# Patient Record
Sex: Female | Born: 1995 | Hispanic: No | Marital: Married | Smoking: Never smoker
Health system: Southern US, Community
[De-identification: ages and names within clinical notes are randomized; demographics above are authoritative.]

## PROBLEM LIST (undated history)

## (undated) ENCOUNTER — Ambulatory Visit

## (undated) DIAGNOSIS — L680 Hirsutism: Secondary | ICD-10-CM

## (undated) DIAGNOSIS — F419 Anxiety disorder, unspecified: Secondary | ICD-10-CM

## (undated) DIAGNOSIS — Z789 Other specified health status: Secondary | ICD-10-CM

## (undated) HISTORY — DX: Anxiety disorder, unspecified: F41.9

## (undated) HISTORY — DX: Hirsutism: L68.0

## (undated) HISTORY — PX: NO PAST SURGERIES: SHX2092

---

## 2015-02-03 ENCOUNTER — Other Ambulatory Visit: Payer: Self-pay | Admitting: Advanced Practice Midwife

## 2015-02-03 DIAGNOSIS — N6320 Unspecified lump in the left breast, unspecified quadrant: Secondary | ICD-10-CM

## 2015-03-15 ENCOUNTER — Other Ambulatory Visit: Payer: Self-pay

## 2016-09-10 ENCOUNTER — Encounter (HOSPITAL_COMMUNITY): Payer: Self-pay | Admitting: Emergency Medicine

## 2016-09-10 ENCOUNTER — Emergency Department (HOSPITAL_COMMUNITY)
Admission: EM | Admit: 2016-09-10 | Discharge: 2016-09-10 | Disposition: A | Discharge status: Home / Self Care | Payer: BLUE CROSS/BLUE SHIELD | Attending: Emergency Medicine | Admitting: Emergency Medicine

## 2016-09-10 DIAGNOSIS — Y999 Unspecified external cause status: Secondary | ICD-10-CM | POA: Diagnosis not present

## 2016-09-10 DIAGNOSIS — X503XXA Overexertion from repetitive movements, initial encounter: Secondary | ICD-10-CM | POA: Insufficient documentation

## 2016-09-10 DIAGNOSIS — Y929 Unspecified place or not applicable: Secondary | ICD-10-CM | POA: Diagnosis not present

## 2016-09-10 DIAGNOSIS — S83005A Unspecified dislocation of left patella, initial encounter: Secondary | ICD-10-CM

## 2016-09-10 DIAGNOSIS — Y9341 Activity, dancing: Secondary | ICD-10-CM | POA: Diagnosis not present

## 2016-09-10 DIAGNOSIS — S8992XA Unspecified injury of left lower leg, initial encounter: Secondary | ICD-10-CM | POA: Diagnosis present

## 2016-09-10 MED ORDER — IBUPROFEN 800 MG PO TABS
800.0000 mg | ORAL_TABLET | Freq: Once | ORAL | Status: AC
  Administered 2016-09-10: 800 mg via ORAL
  Filled 2016-09-10: qty 1

## 2016-09-10 MED ORDER — DIPHENHYDRAMINE HCL 25 MG PO CAPS
25.0000 mg | ORAL_CAPSULE | Freq: Once | ORAL | Status: AC
  Administered 2016-09-10: 25 mg via ORAL
  Filled 2016-09-10: qty 1
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
## 2016-09-10 NOTE — ED Notes (Signed)
Patient Alert and oriented X4. Stable and ambulatory. Patient verbalized understanding of the discharge instructions.  Patient belongings were taken by the patient.  

## 2016-09-10 NOTE — Discharge Instructions (Signed)
Wear the knee immobilizer as needed/  Apply ice several times a day.  Take ibuprofen or naproxen as needed for pain.

## 2016-09-10 NOTE — ED Provider Notes (Signed)
[facility]-EMERGENCY DEPT Provider Note   CSN: [id number] Arrival date & time: 09/10/16  0103   By signing my name below, I, Clarisse GougeXavier Herndon, attest that this documentation has been prepared under the direction and in the presence of Dione BoozeGlick, Deshaun Schou, MD. Electronically signed, Clarisse GougeXavier Herndon, ED Scribe. 09/10/16. 1:23 AM.   History   Chief Complaint Chief Complaint  Patient presents with  . Knee Pain    dislocation   The history is provided by the patient, medical records, a significant other and a relative. No language interpreter was used.    Kristine Singleton is a 21 y.o. female BIB EMS to the Emergency Department concerning L knee pain onset shortly PTA. She states she turned around after dancing, felt her knee crack and fell d/t immediate onset of pain. No head injury or LOC during this fall. She describes 6/10, constant pain worse with straightening the leg and applying pressure on it; she has the leg in a bent position initially on evaluation. Pt given 250 mcg fentanyl PTA with EMS; unable to determine if this intervention was successful. No headache, numbness, weakness, wounds, swelling or color change. No N/V or fever. No other complaints at this time.   History reviewed. No pertinent past medical history.  There are no active problems to display for this patient.   History reviewed. No pertinent surgical history.  OB History    No data available       Home Medications    Prior to Admission medications   Not on File    Family History No family history on file.  Social History Social History  Substance Use Topics  . Smoking status: Never Smoker  . Smokeless tobacco: Never Used  . Alcohol use No     Allergies   Patient has no allergy information on record.   Review of Systems Review of Systems  Constitutional: Negative for chills and fever.  HENT: Negative for facial swelling.   Gastrointestinal: Negative for nausea and vomiting.  Musculoskeletal: Positive for  arthralgias and gait problem. Negative for joint swelling.  Skin: Negative for color change and wound.  Neurological: Negative for syncope, weakness, numbness and headaches.     Physical Exam Updated Vital Signs BP (!) 144/95 (BP Location: Right Arm)   Pulse 99   Temp 98.9 F (37.2 C) (Oral)   Resp 18   SpO2 99%   Physical Exam  Constitutional: She is oriented to person, place, and time. She appears well-developed and well-nourished.  Appears anxious and uncomfortable.  HENT:  Head: Normocephalic and atraumatic.  Eyes: Pupils are equal, round, and reactive to light. EOM are normal.  Neck: Normal range of motion. Neck supple. No JVD present.  Cardiovascular: Normal rate, regular rhythm and normal heart sounds.   No murmur heard. Pulmonary/Chest: Effort normal and breath sounds normal. She has no wheezes. She has no rales. She exhibits no tenderness.  Abdominal: Soft. Bowel sounds are normal. She exhibits no distension and no mass. There is no tenderness.  Musculoskeletal: Normal range of motion. She exhibits deformity. She exhibits no edema.  Deformity of the L knee consistent with knee dislocation laterally  Lymphadenopathy:    She has no cervical adenopathy.  Neurological: She is alert and oriented to person, place, and time. No cranial nerve deficit. She exhibits normal muscle tone. Coordination normal.  Skin: Skin is warm and dry. No rash noted.  Psychiatric: She has a normal mood and affect. Her behavior is normal. Judgment and thought content normal.  Nursing note and vitals reviewed.    ED Treatments / Results  DIAGNOSTIC STUDIES: Oxygen Saturation is 99% on RA, NL by my interpretation.    COORDINATION OF CARE: 1:10 AM-Discussed next steps with pt. Pt verbalized understanding and is agreeable with the plan. Will place in knee immobilizer and d/c with resources and referral to orthopedic specialist. Pt prepared for d/c, advised of symptomatic care at home, F/U  instructions and return precautions.   Procedures Procedures (including critical care time) Reduction of dislocation Date/Time: 1:24 AM Performed by: KGMWN,[email or web address] Authorized by: DGUYQ,IHKVQ Consent: Verbal consent obtained. Risks and benefits: risks, benefits and alternatives were discussed Consent given by: patient Required items: required blood products, implants, devices, and special equipment available Time out: Immediately prior to procedure a "time out" was called to verify the correct patient, procedure, equipment, support staff and site/side marked as required.  Patient sedated: No  Vitals: Vital signs were monitored during sedation. Patient tolerance: Patient tolerated the procedure well with no immediate complications. Joint: Left patella Reduction technique: Knee extension and manipulation of patella. Knee immobilizer applied.    Medications Ordered in ED Medications - No data to display   Initial Impression / Assessment and Plan / ED Course  I have reviewed the triage vital signs and the nursing notes.  Dislocated patella reduced without difficulty. She is placed in a knee immobilizer use as needed, referred to orthopedics for follow-up. No old records in the [facility] system.  Final Clinical Impressions(s) / ED Diagnoses   Final diagnoses:  Closed dislocation of patella, left, initial encounter    New Prescriptions New Prescriptions   No medications on file    I personally performed the services described in this documentation, which was scribed in my presence. The recorded information has been reviewed and is accurate.      Dione Booze, MD 09/10/16 [phone]

## 2018-06-28 LAB — OB RESULTS CONSOLE HEPATITIS B SURFACE ANTIGEN: Hepatitis B Surface Ag: NEGATIVE

## 2018-06-28 LAB — OB RESULTS CONSOLE RPR: RPR: NONREACTIVE

## 2018-06-28 LAB — OB RESULTS CONSOLE ABO/RH: RH Type: POSITIVE

## 2018-06-28 LAB — OB RESULTS CONSOLE GC/CHLAMYDIA
Chlamydia: NEGATIVE
Gonorrhea: NEGATIVE

## 2018-06-28 LAB — OB RESULTS CONSOLE HIV ANTIBODY (ROUTINE TESTING): HIV: NONREACTIVE

## 2018-06-28 LAB — OB RESULTS CONSOLE ANTIBODY SCREEN: Antibody Screen: NEGATIVE

## 2018-06-28 LAB — OB RESULTS CONSOLE RUBELLA ANTIBODY, IGM: Rubella: IMMUNE

## 2018-10-25 ENCOUNTER — Other Ambulatory Visit: Payer: Self-pay

## 2018-10-25 ENCOUNTER — Encounter (HOSPITAL_COMMUNITY): Payer: Self-pay

## 2018-10-25 ENCOUNTER — Inpatient Hospital Stay (HOSPITAL_COMMUNITY)
Admission: AD | Admit: 2018-10-25 | Discharge: 2018-10-25 | Disposition: A | Discharge status: Home / Self Care | Payer: BC Managed Care – PPO | Attending: Obstetrics and Gynecology | Admitting: Obstetrics and Gynecology

## 2018-10-25 DIAGNOSIS — Z3689 Encounter for other specified antenatal screening: Secondary | ICD-10-CM | POA: Diagnosis not present

## 2018-10-25 DIAGNOSIS — Y92411 Interstate highway as the place of occurrence of the external cause: Secondary | ICD-10-CM | POA: Insufficient documentation

## 2018-10-25 DIAGNOSIS — Z3A27 27 weeks gestation of pregnancy: Secondary | ICD-10-CM | POA: Diagnosis not present

## 2018-10-25 DIAGNOSIS — O26892 Other specified pregnancy related conditions, second trimester: Secondary | ICD-10-CM | POA: Insufficient documentation

## 2018-10-25 HISTORY — DX: Other specified health status: Z78.9

## 2018-10-25 LAB — URINALYSIS, ROUTINE W REFLEX MICROSCOPIC
Bilirubin Urine: NEGATIVE
Glucose, UA: NEGATIVE mg/dL
Hgb urine dipstick: NEGATIVE
Ketones, ur: NEGATIVE mg/dL
Leukocytes,Ua: NEGATIVE
Nitrite: NEGATIVE
Protein, ur: NEGATIVE mg/dL
Specific Gravity, Urine: 1.018 (ref 1.005–1.030)
pH: 6 (ref 5.0–8.0)

## 2018-10-25 MED ORDER — ACETAMINOPHEN 500 MG PO TABS
1000.0000 mg | ORAL_TABLET | Freq: Four times a day (QID) | ORAL | Status: DC | PRN
  Administered 2018-10-25: 1000 mg via ORAL
  Filled 2018-10-25: qty 2
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
## 2018-12-24 LAB — OB RESULTS CONSOLE GBS: GBS: NEGATIVE

## 2019-01-17 ENCOUNTER — Telehealth (HOSPITAL_COMMUNITY): Payer: Self-pay | Admitting: *Deleted

## 2019-01-17 ENCOUNTER — Encounter (HOSPITAL_COMMUNITY): Payer: Self-pay | Admitting: *Deleted

## 2019-01-17 ENCOUNTER — Other Ambulatory Visit (HOSPITAL_COMMUNITY)
Admission: RE | Admit: 2019-01-17 | Discharge: 2019-01-17 | Disposition: A | Discharge status: Home / Self Care | Payer: BC Managed Care – PPO | Source: Ambulatory Visit | Attending: Obstetrics & Gynecology | Admitting: Obstetrics & Gynecology

## 2019-01-17 ENCOUNTER — Other Ambulatory Visit: Payer: Self-pay | Admitting: Obstetrics & Gynecology

## 2019-01-17 DIAGNOSIS — Z20828 Contact with and (suspected) exposure to other viral communicable diseases: Secondary | ICD-10-CM | POA: Insufficient documentation

## 2019-01-17 DIAGNOSIS — Z01812 Encounter for preprocedural laboratory examination: Secondary | ICD-10-CM | POA: Insufficient documentation

## 2019-01-17 LAB — SARS CORONAVIRUS 2 (TAT 6-24 HRS): SARS Coronavirus 2: NEGATIVE

## 2019-01-17 NOTE — Telephone Encounter (Signed)
Preadmission screen  

## 2019-01-18 ENCOUNTER — Other Ambulatory Visit: Payer: Self-pay

## 2019-01-18 ENCOUNTER — Inpatient Hospital Stay (HOSPITAL_COMMUNITY)
Admission: AD | Admit: 2019-01-18 | Discharge: 2019-01-20 | DRG: 788 | Disposition: A | Discharge status: Home / Self Care | Payer: BC Managed Care – PPO | Attending: Obstetrics & Gynecology | Admitting: Obstetrics & Gynecology

## 2019-01-18 ENCOUNTER — Inpatient Hospital Stay (HOSPITAL_COMMUNITY): Payer: BC Managed Care – PPO | Admitting: Anesthesiology

## 2019-01-18 ENCOUNTER — Inpatient Hospital Stay (HOSPITAL_COMMUNITY): Payer: BC Managed Care – PPO

## 2019-01-18 ENCOUNTER — Encounter (HOSPITAL_COMMUNITY)
Admission: AD | Disposition: A | Discharge status: Home / Self Care | Payer: Self-pay | Source: Home / Self Care | Attending: Obstetrics & Gynecology

## 2019-01-18 ENCOUNTER — Encounter (HOSPITAL_COMMUNITY): Payer: Self-pay

## 2019-01-18 DIAGNOSIS — Z3A39 39 weeks gestation of pregnancy: Secondary | ICD-10-CM | POA: Diagnosis not present

## 2019-01-18 DIAGNOSIS — O26893 Other specified pregnancy related conditions, third trimester: Secondary | ICD-10-CM | POA: Diagnosis present

## 2019-01-18 DIAGNOSIS — O322XX0 Maternal care for transverse and oblique lie, not applicable or unspecified: Secondary | ICD-10-CM | POA: Diagnosis present

## 2019-01-18 DIAGNOSIS — Z20828 Contact with and (suspected) exposure to other viral communicable diseases: Secondary | ICD-10-CM | POA: Diagnosis present

## 2019-01-18 DIAGNOSIS — O99284 Endocrine, nutritional and metabolic diseases complicating childbirth: Secondary | ICD-10-CM | POA: Diagnosis present

## 2019-01-18 DIAGNOSIS — E282 Polycystic ovarian syndrome: Secondary | ICD-10-CM | POA: Diagnosis present

## 2019-01-18 DIAGNOSIS — Z98891 History of uterine scar from previous surgery: Secondary | ICD-10-CM

## 2019-01-18 DIAGNOSIS — L680 Hirsutism: Secondary | ICD-10-CM | POA: Diagnosis present

## 2019-01-18 DIAGNOSIS — Z3490 Encounter for supervision of normal pregnancy, unspecified, unspecified trimester: Secondary | ICD-10-CM | POA: Diagnosis present

## 2019-01-18 DIAGNOSIS — O326XX0 Maternal care for compound presentation, not applicable or unspecified: Principal | ICD-10-CM | POA: Diagnosis present

## 2019-01-18 DIAGNOSIS — O99892 Other specified diseases and conditions complicating childbirth: Secondary | ICD-10-CM

## 2019-01-18 LAB — CBC
HCT: 38.1 % (ref 36.0–46.0)
Hemoglobin: 12.4 g/dL (ref 12.0–15.0)
MCH: 28 pg (ref 26.0–34.0)
MCHC: 32.5 g/dL (ref 30.0–36.0)
MCV: 86 fL (ref 80.0–100.0)
Platelets: 253 10*3/uL (ref 150–400)
RBC: 4.43 MIL/uL (ref 3.87–5.11)
RDW: 14.2 % (ref 11.5–15.5)
WBC: 13.2 10*3/uL — ABNORMAL HIGH (ref 4.0–10.5)
nRBC: 0 % (ref 0.0–0.2)

## 2019-01-18 LAB — TYPE AND SCREEN
ABO/RH(D): B POS
Antibody Screen: NEGATIVE

## 2019-01-18 LAB — ABO/RH: ABO/RH(D): B POS

## 2019-01-18 LAB — RPR: RPR Ser Ql: NONREACTIVE

## 2019-01-18 SURGERY — Surgical Case
Anesthesia: Spinal

## 2019-01-18 MED ORDER — DIPHENHYDRAMINE HCL 25 MG PO CAPS
25.0000 mg | ORAL_CAPSULE | Freq: Four times a day (QID) | ORAL | Status: DC | PRN
  Administered 2019-01-19: 25 mg via ORAL
  Filled 2019-01-18: qty 1

## 2019-01-18 MED ORDER — ACETAMINOPHEN 325 MG PO TABS
650.0000 mg | ORAL_TABLET | ORAL | Status: DC | PRN
  Administered 2019-01-18 – 2019-01-20 (×5): 650 mg via ORAL
  Filled 2019-01-18 (×5): qty 2

## 2019-01-18 MED ORDER — SIMETHICONE 80 MG PO CHEW
80.0000 mg | CHEWABLE_TABLET | Freq: Three times a day (TID) | ORAL | Status: DC
  Administered 2019-01-19 – 2019-01-20 (×4): 80 mg via ORAL
  Filled 2019-01-18 (×4): qty 1

## 2019-01-18 MED ORDER — PRENATAL MULTIVITAMIN CH
1.0000 | ORAL_TABLET | Freq: Every day | ORAL | Status: DC
  Administered 2019-01-19 – 2019-01-20 (×2): 1 via ORAL
  Filled 2019-01-18 (×2): qty 1

## 2019-01-18 MED ORDER — IBUPROFEN 800 MG PO TABS
800.0000 mg | ORAL_TABLET | Freq: Four times a day (QID) | ORAL | Status: DC
  Administered 2019-01-20: 800 mg via ORAL
  Filled 2019-01-18: qty 1

## 2019-01-18 MED ORDER — PHENYLEPHRINE HCL-NACL 20-0.9 MG/250ML-% IV SOLN
INTRAVENOUS | Status: DC | PRN
  Administered 2019-01-18: 60 ug/min via INTRAVENOUS

## 2019-01-18 MED ORDER — OXYTOCIN 40 UNITS IN NORMAL SALINE INFUSION - SIMPLE MED: 2.5000 [IU]/h | INTRAVENOUS | Status: AC

## 2019-01-18 MED ORDER — MORPHINE SULFATE (PF) 0.5 MG/ML IJ SOLN
INTRAMUSCULAR | Status: AC
  Filled 2019-01-18: qty 10

## 2019-01-18 MED ORDER — NALBUPHINE SYRINGE 5 MG/0.5 ML
5.0000 mg | INJECTION | Freq: Once | INTRAMUSCULAR | Status: DC | PRN
  Filled 2019-01-18: qty 0.5

## 2019-01-18 MED ORDER — LACTATED RINGERS IV SOLN
INTRAVENOUS | Status: DC
  Administered 2019-01-18 (×2): via INTRAVENOUS

## 2019-01-18 MED ORDER — FENTANYL CITRATE (PF) 100 MCG/2ML IJ SOLN: 25.0000 ug | INTRAMUSCULAR | Status: DC | PRN

## 2019-01-18 MED ORDER — ONDANSETRON HCL 4 MG/2ML IJ SOLN
INTRAMUSCULAR | Status: DC | PRN
  Administered 2019-01-18: 4 mg via INTRAVENOUS

## 2019-01-18 MED ORDER — MENTHOL 3 MG MT LOZG: 1.0000 | LOZENGE | OROMUCOSAL | Status: DC | PRN

## 2019-01-18 MED ORDER — OXYTOCIN BOLUS FROM INFUSION: 500.0000 mL | Freq: Once | INTRAVENOUS | Status: DC

## 2019-01-18 MED ORDER — SENNOSIDES-DOCUSATE SODIUM 8.6-50 MG PO TABS
2.0000 | ORAL_TABLET | ORAL | Status: DC
  Administered 2019-01-18 – 2019-01-19 (×2): 2 via ORAL
  Filled 2019-01-18 (×2): qty 2

## 2019-01-18 MED ORDER — OXYTOCIN 40 UNITS IN NORMAL SALINE INFUSION - SIMPLE MED
INTRAVENOUS | Status: DC | PRN
  Administered 2019-01-18: 40 [IU] via INTRAVENOUS

## 2019-01-18 MED ORDER — ACETAMINOPHEN 325 MG PO TABS: 650.0000 mg | ORAL_TABLET | ORAL | Status: DC | PRN

## 2019-01-18 MED ORDER — BUPIVACAINE IN DEXTROSE 0.75-8.25 % IT SOLN
INTRATHECAL | Status: DC | PRN
  Administered 2019-01-18: 1.6 mL via INTRATHECAL

## 2019-01-18 MED ORDER — SIMETHICONE 80 MG PO CHEW
80.0000 mg | CHEWABLE_TABLET | ORAL | Status: DC
  Administered 2019-01-18 – 2019-01-19 (×2): 80 mg via ORAL
  Filled 2019-01-18 (×2): qty 1

## 2019-01-18 MED ORDER — PHENYLEPHRINE HCL-NACL 20-0.9 MG/250ML-% IV SOLN
INTRAVENOUS | Status: AC
  Filled 2019-01-18: qty 250

## 2019-01-18 MED ORDER — OXYCODONE HCL 5 MG PO TABS: 5.0000 mg | ORAL_TABLET | ORAL | Status: DC | PRN

## 2019-01-18 MED ORDER — FENTANYL CITRATE (PF) 100 MCG/2ML IJ SOLN
INTRAMUSCULAR | Status: DC | PRN
  Administered 2019-01-18: 15 ug via INTRATHECAL

## 2019-01-18 MED ORDER — DIPHENHYDRAMINE HCL 25 MG PO CAPS: 25.0000 mg | ORAL_CAPSULE | ORAL | Status: DC | PRN

## 2019-01-18 MED ORDER — MORPHINE SULFATE (PF) 0.5 MG/ML IJ SOLN
INTRAMUSCULAR | Status: DC | PRN
  Administered 2019-01-18: .15 mg via INTRATHECAL

## 2019-01-18 MED ORDER — CEFAZOLIN SODIUM-DEXTROSE 2-4 GM/100ML-% IV SOLN
2.0000 g | INTRAVENOUS | Status: AC
  Administered 2019-01-18: 2 g via INTRAVENOUS

## 2019-01-18 MED ORDER — BUTORPHANOL TARTRATE 1 MG/ML IJ SOLN: 1.0000 mg | INTRAMUSCULAR | Status: DC | PRN

## 2019-01-18 MED ORDER — COCONUT OIL OIL: 1.0000 "application " | TOPICAL_OIL | Status: DC | PRN

## 2019-01-18 MED ORDER — TERBUTALINE SULFATE 1 MG/ML IJ SOLN: 0.2500 mg | Freq: Once | INTRAMUSCULAR | Status: DC | PRN

## 2019-01-18 MED ORDER — ONDANSETRON HCL 4 MG/2ML IJ SOLN: 4.0000 mg | Freq: Four times a day (QID) | INTRAMUSCULAR | Status: DC | PRN

## 2019-01-18 MED ORDER — NALOXONE HCL 4 MG/10ML IJ SOLN
1.0000 ug/kg/h | INTRAVENOUS | Status: DC | PRN
  Filled 2019-01-18: qty 5

## 2019-01-18 MED ORDER — TETANUS-DIPHTH-ACELL PERTUSSIS 5-2.5-18.5 LF-MCG/0.5 IM SUSP: 0.5000 mL | Freq: Once | INTRAMUSCULAR | Status: DC

## 2019-01-18 MED ORDER — OXYTOCIN 40 UNITS IN NORMAL SALINE INFUSION - SIMPLE MED: 2.5000 [IU]/h | INTRAVENOUS | Status: DC

## 2019-01-18 MED ORDER — NALOXONE HCL 0.4 MG/ML IJ SOLN: 0.4000 mg | INTRAMUSCULAR | Status: DC | PRN

## 2019-01-18 MED ORDER — ONDANSETRON HCL 4 MG/2ML IJ SOLN
INTRAMUSCULAR | Status: AC
  Filled 2019-01-18: qty 2

## 2019-01-18 MED ORDER — SOD CITRATE-CITRIC ACID 500-334 MG/5ML PO SOLN
30.0000 mL | ORAL | Status: DC | PRN
  Administered 2019-01-18: 30 mL via ORAL
  Filled 2019-01-18: qty 30

## 2019-01-18 MED ORDER — MEPERIDINE HCL 25 MG/ML IJ SOLN: 6.2500 mg | INTRAMUSCULAR | Status: DC | PRN

## 2019-01-18 MED ORDER — LIDOCAINE HCL (PF) 1 % IJ SOLN: 30.0000 mL | INTRAMUSCULAR | Status: DC | PRN

## 2019-01-18 MED ORDER — DIBUCAINE (PERIANAL) 1 % EX OINT: 1.0000 "application " | TOPICAL_OINTMENT | CUTANEOUS | Status: DC | PRN

## 2019-01-18 MED ORDER — KETOROLAC TROMETHAMINE 30 MG/ML IJ SOLN
INTRAMUSCULAR | Status: AC
  Filled 2019-01-18: qty 1

## 2019-01-18 MED ORDER — KETOROLAC TROMETHAMINE 30 MG/ML IJ SOLN
30.0000 mg | Freq: Four times a day (QID) | INTRAMUSCULAR | Status: AC | PRN
  Administered 2019-01-18: 30 mg via INTRAMUSCULAR

## 2019-01-18 MED ORDER — FENTANYL CITRATE (PF) 100 MCG/2ML IJ SOLN
INTRAMUSCULAR | Status: AC
  Filled 2019-01-18: qty 2

## 2019-01-18 MED ORDER — SODIUM CHLORIDE 0.9 % IV SOLN
INTRAVENOUS | Status: DC | PRN
  Administered 2019-01-18: 19:00:00 via INTRAVENOUS

## 2019-01-18 MED ORDER — KETOROLAC TROMETHAMINE 30 MG/ML IJ SOLN
30.0000 mg | Freq: Four times a day (QID) | INTRAMUSCULAR | Status: AC | PRN
  Administered 2019-01-19: 30 mg via INTRAVENOUS
  Filled 2019-01-18: qty 1

## 2019-01-18 MED ORDER — NALBUPHINE SYRINGE 5 MG/0.5 ML
5.0000 mg | INJECTION | INTRAMUSCULAR | Status: DC | PRN
  Filled 2019-01-18: qty 0.5

## 2019-01-18 MED ORDER — ONDANSETRON HCL 4 MG/2ML IJ SOLN: 4.0000 mg | Freq: Three times a day (TID) | INTRAMUSCULAR | Status: DC | PRN

## 2019-01-18 MED ORDER — SIMETHICONE 80 MG PO CHEW: 80.0000 mg | CHEWABLE_TABLET | ORAL | Status: DC | PRN

## 2019-01-18 MED ORDER — WITCH HAZEL-GLYCERIN EX PADS: 1.0000 "application " | MEDICATED_PAD | CUTANEOUS | Status: DC | PRN

## 2019-01-18 MED ORDER — DIPHENHYDRAMINE HCL 50 MG/ML IJ SOLN: 12.5000 mg | INTRAMUSCULAR | Status: DC | PRN

## 2019-01-18 MED ORDER — LACTATED RINGERS IV SOLN
INTRAVENOUS | Status: DC
  Administered 2019-01-19 (×2): via INTRAVENOUS

## 2019-01-18 MED ORDER — OXYTOCIN 40 UNITS IN NORMAL SALINE INFUSION - SIMPLE MED
INTRAVENOUS | Status: AC
  Filled 2019-01-18: qty 1000

## 2019-01-18 MED ORDER — ZOLPIDEM TARTRATE 5 MG PO TABS: 5.0000 mg | ORAL_TABLET | Freq: Every evening | ORAL | Status: DC | PRN

## 2019-01-18 MED ORDER — LACTATED RINGERS IV SOLN: 500.0000 mL | INTRAVENOUS | Status: DC | PRN

## 2019-01-18 MED ORDER — SODIUM CHLORIDE 0.9% FLUSH: 3.0000 mL | INTRAVENOUS | Status: DC | PRN

## 2019-01-18 MED ORDER — OXYTOCIN 40 UNITS IN NORMAL SALINE INFUSION - SIMPLE MED
1.0000 m[IU]/min | INTRAVENOUS | Status: DC
  Administered 2019-01-18: 2 m[IU]/min via INTRAVENOUS
  Filled 2019-01-18: qty 1000

## 2019-01-18 MED ORDER — KETOROLAC TROMETHAMINE 30 MG/ML IJ SOLN
30.0000 mg | Freq: Four times a day (QID) | INTRAMUSCULAR | Status: DC
  Administered 2019-01-19: 30 mg via INTRAVENOUS
  Filled 2019-01-18: qty 1

## 2019-01-18 SURGICAL SUPPLY — 37 items
BENZOIN TINCTURE PRP APPL 2/3 (GAUZE/BANDAGES/DRESSINGS) ×3 IMPLANT
CHLORAPREP W/TINT 26ML (MISCELLANEOUS) ×3 IMPLANT
CLAMP CORD UMBIL (MISCELLANEOUS) IMPLANT
CLOSURE STERI STRIP 1/2 X4 (GAUZE/BANDAGES/DRESSINGS) ×3 IMPLANT
CLOSURE WOUND 1/2 X4 (GAUZE/BANDAGES/DRESSINGS)
CLOTH BEACON ORANGE TIMEOUT ST (SAFETY) ×3 IMPLANT
DRSG OPSITE POSTOP 4X10 (GAUZE/BANDAGES/DRESSINGS) ×3 IMPLANT
ELECT REM PT RETURN 9FT ADLT (ELECTROSURGICAL) ×3
ELECTRODE REM PT RTRN 9FT ADLT (ELECTROSURGICAL) ×1 IMPLANT
EXTRACTOR VACUUM KIWI (MISCELLANEOUS) IMPLANT
EXTRACTOR VACUUM M CUP 4 TUBE (SUCTIONS) IMPLANT
EXTRACTOR VACUUM M CUP 4' TUBE (SUCTIONS)
GLOVE BIO SURGEON STRL SZ7 (GLOVE) ×3 IMPLANT
GLOVE BIOGEL PI IND STRL 7.0 (GLOVE) ×2 IMPLANT
GLOVE BIOGEL PI INDICATOR 7.0 (GLOVE) ×4
GOWN STRL REUS W/TWL LRG LVL3 (GOWN DISPOSABLE) ×6 IMPLANT
KIT ABG SYR 3ML LUER SLIP (SYRINGE) IMPLANT
NEEDLE HYPO 25X5/8 SAFETYGLIDE (NEEDLE) IMPLANT
NS IRRIG 1000ML POUR BTL (IV SOLUTION) ×3 IMPLANT
PACK C SECTION WH (CUSTOM PROCEDURE TRAY) ×3 IMPLANT
PAD OB MATERNITY 4.3X12.25 (PERSONAL CARE ITEMS) ×3 IMPLANT
RTRCTR C-SECT PINK 25CM LRG (MISCELLANEOUS) IMPLANT
STRIP CLOSURE SKIN 1/2X4 (GAUZE/BANDAGES/DRESSINGS) IMPLANT
SUT MNCRL 0 VIOLET CTX 36 (SUTURE) ×2 IMPLANT
SUT MONOCRYL 0 CTX 36 (SUTURE) ×4
SUT PLAIN 0 NONE (SUTURE) IMPLANT
SUT PLAIN 2 0 (SUTURE)
SUT PLAIN ABS 2-0 CT1 27XMFL (SUTURE) IMPLANT
SUT VIC AB 0 CT1 27 (SUTURE) ×4
SUT VIC AB 0 CT1 27XBRD ANBCTR (SUTURE) ×2 IMPLANT
SUT VIC AB 2-0 CT1 27 (SUTURE) ×2
SUT VIC AB 2-0 CT1 TAPERPNT 27 (SUTURE) ×1 IMPLANT
SUT VIC AB 4-0 KS 27 (SUTURE) ×3 IMPLANT
SUT VICRYL 0 TIES 12 18 (SUTURE) IMPLANT
TOWEL OR 17X24 6PK STRL BLUE (TOWEL DISPOSABLE) ×3 IMPLANT
TRAY FOLEY W/BAG SLVR 14FR LF (SET/KITS/TRAYS/PACK) IMPLANT
WATER STERILE IRR 1000ML POUR (IV SOLUTION) ×3 IMPLANT

## 2019-01-18 NOTE — Op Note (Signed)
Cesarean Section Procedure Note   Kristine Singleton  01/18/2019  Indications: Compound presentation with hand prolapse 39.3 wks, labor IOL for favorable cervix and borderline pelvis. Cephalic presentation at amniotomy with fingers palpated on right of the head. All position changes failed to move hand away and as she dilated to 4 cm, hand prolapsed out of the cervix with contractions while head was still palpable towards maternal left. Baby was not in longitudinal lie at this point.   Pre-operative Diagnosis: primary csection for hand presentation.   Post-operative Diagnosis: Same   Surgeon:  Shea Evans, MD  Assistants: Carlean Jews, CNM  Anesthesia: spinal   Procedure Details:  The patient was seen in the Labor Room. The risks, benefits, complications, treatment options, and expected outcomes were discussed with the patient. The patient concurred with the proposed plan, giving informed consent. identified as Kristine Singleton and the procedure verified as C-Section Delivery. A Time Out was held and the above information confirmed. 2 gm Ancef given.  After induction of anesthesia, the patient was draped and prepped in the usual sterile manner, foley was draining urine well.  A pfannenstiel incision was made and carried down through the subcutaneous tissue to the fascia. Fascial incision was made and extended transversely. The fascia was separated from the underlying rectus tissue superiorly and inferiorly. The peritoneum was identified and entered. Peritoneal incision was extended longitudinally. Alexis-O retractor placed. The utero-vesical peritoneal reflection was incised transversely and the bladder flap was bluntly freed from the lower uterine segment. A low transverse uterine incision was made. Head was easy to reach as most of it was in left lower segment and right shoulder was in midline with rightarm extended towards the cervix and right hand in the cervix. Delivery was cephalic after  pushing shoulder back to the right and centering the head at the hysterotomy, followed by flexion, rotation of head and delivery. Two loose nuchal cord loops released and baby was delivered. Girl baby delivered at 18.36 hors with vigorous cry. Apgar scores of 8 at one minute and 9 at five minutes. Delayed cord clamping done at 1 minute and baby handed to NICU team in attendance. Cord ph was not sent. Cord blood was obtained for evaluation. The placenta was removed Intact and appeared normal. The uterine outline, tubes and ovaries appeared normal}. The uterine incision was closed with running locked sutures of . A second imbricating layer sutured.   Hemostasis was observed. Alexis retractor removed. Peritoneal closure done with 2-0 Vicryl.  The fascia was then reapproximated with running sutures of 0Vicryl. The subcuticular closure was performed using 2-0plain gut. The skin was closed with 4-0Vicryl. Sterile dressings placed.   Instrument, sponge, and needle counts were correct prior the abdominal closure and were correct at the conclusion of the case.   Findings: Right hand presenting below head and hand/ fingers prolapsing through cervix with contraction. At low transverse hysterotomy, head was noted to be slightly towards left and right shoulder was in midline with arm forearm in lower segment and hand in cervix. Shoulder was rotated back to maternal right and head brought to midline followed by flexion and cephalic delivery.  Loose nuchal cord x 2, reduced over the head after head delivery and baby was delivered smoothly thereafter. Apgars 8, 9. Normal cord, placenta. Normal tubes, ovaries.    Estimated Blood Loss: <400 cc   Total IV Fluids: 2000 ml LR   Urine Output: 50CC OF clear urine  Specimens: cord blood   Complications: no complications  Disposition: PACU - hemodynamically stable.   Maternal Condition: stable   Baby condition / location:  Couplet care / Skin to  Skin  Attending Attestation: I performed the procedure.   Signed: Surgeon(s): Azucena Fallen, MD

## 2019-01-18 NOTE — Transfer of Care (Signed)
Immediate Anesthesia Transfer of Care Note  Patient: Kristine Singleton  Procedure(s) Performed: CESAREAN SECTION (N/A )  Patient Location: PACU  Anesthesia Type:Spinal  Level of Consciousness: awake  Airway & Oxygen Therapy: Patient Spontanous Breathing  Post-op Assessment: Report given to RN and Post -op Vital signs reviewed and stable  Post vital signs: Reviewed and stable  Last Vitals:  Vitals Value Taken Time  BP    Temp    Pulse    Resp    SpO2      Last Pain:  Vitals:   01/18/19 1500  TempSrc:   PainSc: 4          Complications: No apparent anesthesia complications

## 2019-01-18 NOTE — Anesthesia Postprocedure Evaluation (Signed)
Anesthesia Post Note  Patient: Mikena Masoner  Procedure(s) Performed: CESAREAN SECTION (N/A )     Patient location during evaluation: PACU Anesthesia Type: Spinal Level of consciousness: oriented and awake and alert Pain management: pain level controlled Vital Signs Assessment: post-procedure vital signs reviewed and stable Respiratory status: spontaneous breathing, respiratory function stable and nonlabored ventilation Cardiovascular status: blood pressure returned to baseline and stable Postop Assessment: no headache, no backache, no apparent nausea or vomiting, spinal receding and patient able to bend at knees Anesthetic complications: no    Last Vitals:  Vitals:   01/18/19 2000 01/18/19 2015  BP: (!) 81/43 (!) 84/56  Pulse: 71 87  Resp: 13 14  Temp:    SpO2: 100% 100%    Last Pain:  Vitals:   01/18/19 2015  TempSrc:   PainSc: 0-No pain   Pain Goal:    LLE Motor Response: Purposeful movement (01/18/19 2015)   RLE Motor Response: Purposeful movement (01/18/19 2015)   L Sensory Level: S1-Sole of foot, small toes (01/18/19 2015) R Sensory Level: S1-Sole of foot, small toes (01/18/19 2015) Epidural/Spinal Function Cutaneous sensation: Able to Wiggle Toes (01/18/19 2015), Patient able to flex knees: No (01/18/19 2015), Patient able to lift hips off bed: No (01/18/19 2015), Back pain beyond tenderness at insertion site: No (01/18/19 2015), Progressively worsening motor and/or sensory loss: No (01/18/19 2015), Bowel and/or bladder incontinence post epidural: No (01/18/19 2015)  Dean Wonder A.

## 2019-01-18 NOTE — Progress Notes (Signed)
Patient ID: Kristine Singleton, female   DOB: August 25, 1995, 23 y.o.   MRN: [id number] Hand prolapse, Cx 3-4 cm, cant reduce behind the head Proceed with Emerg C/section  Risks/complications of surgery reviewed incl infection, bleeding, damage to internal organs including bladder, bowels, ureters, blood vessels, other risks from anesthesia, VTE and delayed complications of any surgery, complications in future surgery reviewed. Also discussed neonatal complications incl difficult delivery, laceration, vacuum assistance, TTN etc. Pt understands and agrees, all concerns addressed.    V.Ezekiel Menzer, MD

## 2019-01-18 NOTE — Anesthesia Preprocedure Evaluation (Signed)
Anesthesia Evaluation  Patient identified by MRN, date of birth, ID band Patient awake    Reviewed: Allergy & Precautions, NPO status , Patient's Chart, lab work & pertinent test results  Airway Mallampati: II  TM Distance: >3 FB Neck ROM: Full    Dental no notable dental hx. (+) Teeth Intact   Pulmonary neg pulmonary ROS,    Pulmonary exam normal breath sounds clear to auscultation       Cardiovascular negative cardio ROS Normal cardiovascular exam Rhythm:Regular Rate:Normal     Neuro/Psych Anxiety negative neurological ROS     GI/Hepatic Neg liver ROS, GERD  ,  Endo/Other  negative endocrine ROS  Renal/GU negative Renal ROS  negative genitourinary   Musculoskeletal negative musculoskeletal ROS (+)   Abdominal   Peds  Hematology negative hematology ROS (+)   Anesthesia Other Findings   Reproductive/Obstetrics (+) Pregnancy Malpresentation                              Anesthesia Physical Anesthesia Plan  ASA: II and emergent  Anesthesia Plan: Spinal   Post-op Pain Management:    Induction:   PONV Risk Score and Plan: 4 or greater and Scopolamine patch - Pre-op, Ondansetron, Treatment may vary due to age or medical condition and Dexamethasone  Airway Management Planned: Natural Airway  Additional Equipment:   Intra-op Plan:   Post-operative Plan:   Informed Consent: I have reviewed the patients History and Physical, chart, labs and discussed the procedure including the risks, benefits and alternatives for the proposed anesthesia with the patient or authorized representative who has indicated his/her understanding and acceptance.     Dental advisory given  Plan Discussed with: CRNA and Surgeon  Anesthesia Plan Comments:         Anesthesia Quick Evaluation

## 2019-01-18 NOTE — Anesthesia Procedure Notes (Signed)
Spinal  Patient location during procedure: OR Start time: 01/18/2019 6:12 PM End time: 01/18/2019 6:15 PM Staffing Anesthesiologist: Josephine Igo, MD Performed: anesthesiologist  Preanesthetic Checklist Completed: patient identified, site marked, surgical consent, pre-op evaluation, timeout performed, IV checked, risks and benefits discussed and monitors and equipment checked Spinal Block Patient position: sitting Prep: site prepped and draped and DuraPrep Patient monitoring: heart rate, cardiac monitor, continuous pulse ox and blood pressure Approach: midline Location: L3-4 Injection technique: single-shot Needle Needle type: Pencan  Needle gauge: 24 G Needle length: 9 cm Needle insertion depth: 6 cm Assessment Sensory level: T4 Additional Notes Patient tolerated procedure well. Adequate sensory level.
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
## 2019-01-19 ENCOUNTER — Encounter (HOSPITAL_COMMUNITY): Payer: Self-pay | Admitting: *Deleted

## 2019-01-19 LAB — CBC
HCT: 32.6 % — ABNORMAL LOW (ref 36.0–46.0)
Hemoglobin: 10.5 g/dL — ABNORMAL LOW (ref 12.0–15.0)
MCH: 27.8 pg (ref 26.0–34.0)
MCHC: 32.2 g/dL (ref 30.0–36.0)
MCV: 86.2 fL (ref 80.0–100.0)
Platelets: 198 10*3/uL (ref 150–400)
RBC: 3.78 MIL/uL — ABNORMAL LOW (ref 3.87–5.11)
RDW: 14.5 % (ref 11.5–15.5)
WBC: 14 10*3/uL — ABNORMAL HIGH (ref 4.0–10.5)
nRBC: 0 % (ref 0.0–0.2)

## 2019-01-19 NOTE — Lactation Note (Signed)
This note was copied from a baby's chart. Lactation Consultation Note Baby 37 hrs old. Mom states baby is feeding well.  Mom demonstrated hand expression w/colostrum noted. Mom has everted nipples. Newborn behavior, STS, I&O, feeding habits, supply and demand. Mom encouraged to feed baby 8-12 times/24 hours and with feeding cues.  Mom has no concerns at this time. Encouraged to call if has questions or concerns Lactation brochure given. Baby sleeping in bed.  Patient Name: Kristine Singleton AXKPV'V Date: 01/19/2019 Reason for consult: Initial assessment;Primapara;Term   Maternal Data Has patient been taught Hand Expression?: Yes Does the patient have breastfeeding experience prior to this delivery?: No  Feeding    LATCH Score       Type of Nipple: Everted at rest and after stimulation  Comfort (Breast/Nipple): Soft / non-tender        Interventions Interventions: Breast feeding basics reviewed;Breast massage;Hand express;Breast compression  Lactation Tools Discussed/Used WIC Program: No   Consult Status Consult Status: Follow-up Date: 01/20/19 Follow-up type: In-patient    Theodoro Kalata 01/19/2019, 6:36 AM

## 2019-01-19 NOTE — Progress Notes (Signed)
MOB was referred for history of depression/anxiety. * Referral screened out by Clinical Social Worker because none of the following criteria appear to apply: ~ History of anxiety/depression during this pregnancy, or of post-partum depression following prior delivery. ~ Diagnosis of anxiety and/or depression within last 3 years OR * MOB's symptoms currently being treated with medication and/or therapy. Please contact the Clinical Social Worker if needs arise, by MOB request, or if MOB scores greater than 9/yes to question 10 on Edinburgh Postpartum Depression Screen.  Hattie Aguinaldo Boyd-Gilyard, MSW, LCSW Clinical Social Work [phone]  

## 2019-01-19 NOTE — Progress Notes (Signed)
Subjective: Postpartum Day 1, Primary Cesarean Delivery for hand presentation/ hand prolapse  Patient reports tolerating PO and + flatus.  Foley was in at AM rounds and plan to remove soon. Minimal pain..  Girl "Ria" Breast feeding going on very well.   Objective: Vital signs in last 24 hours: Temp:  [96.4 F (35.8 C)-98.7 F (37.1 C)] 98 F (36.7 C) (11/22 1300) Pulse Rate:  [68-93] 80 (11/22 1300) Resp:  [7-23] 18 (11/22 1300) BP: (81-111)/(43-86) 104/70 (11/22 1300) SpO2:  [97 %-100 %] 100 % (11/22 1300)  Physical Exam:  General: alert  Lungs CTA bilat CV RRR Lochia: appropriate Uterine Fundus: firm. NABS, soft abdomen Incision: no significant drainage DVT Evaluation: No evidence of DVT seen on physical exam.  CBC Latest Ref Rng & Units 01/19/2019 01/18/2019  WBC 4.0 - 10.5 K/uL 14.0(H) 13.2(H)  Hemoglobin 12.0 - 15.0 g/dL 10.5(L) 12.4  Hematocrit 36.0 - 46.0 % 32.6(L) 38.1  Platelets 150 - 400 K/uL 198 253    B+ Rub Imm TDAP, Flu vaccine in preg.  Assessment/Plan: Status post Cesarean section. Doing well postoperatively.  Continue current care Post-op and PP care reviewed including warning signs D/c tomorrow possible, if both doing well, Great support at home- husband will have time off, her parents down from [place], grandparents have arrive and MIL is here- is NICU Therapist, sports. Kristine Singleton Kristine Singleton 01/19/2019, 1:48 PM

## 2019-01-20 MED ORDER — OXYCODONE HCL 5 MG PO TABS: 5.0000 mg | ORAL_TABLET | ORAL | 0 refills | Status: DC | PRN

## 2019-01-20 MED ORDER — IBUPROFEN 800 MG PO TABS: 800.0000 mg | ORAL_TABLET | Freq: Four times a day (QID) | ORAL | 8 refills | Status: DC

## 2019-01-20 NOTE — Progress Notes (Signed)
SVD: primary  S:  Pt reports feeling  Well. Request early discharge/ Tolerating po/ Voiding without problems/ No n/v/ Bleeding is moderate/ Pain controlled withprescription NSAID's including motrin and narcotic analgesics including oxycodone (Oxycontin, Oxyir)    O:  A & O x 3  / VS: Blood pressure 96/65, pulse 88, temperature 98.2 F (36.8 C), temperature source Oral, resp. rate 16, height 5\' 5"  (1.651 m), weight 74.4 kg, SpO2 100 %, unknown if currently breastfeeding.  LABS: No results found for this or any previous visit (from the past 24 hour(s)).  I&O: I/O last 3 completed shifts: In: 4097.3 [I.V.:4097.3] Out: 3028 [Urine:2815; Blood:213]   No intake/output data recorded.  Lungs: chest clear, no wheezing, rales, normal symmetric air entry  Heart: regular rate and rhythm, S1, S2 normal, no murmur, click, rub or gallop  Abdomen: soft uterus at umb firm surg tender  Perineum: is normal  Lochia: mod  Extremities:no edema    A/P: POD #2 /PPD # 2/ [id number]  Doing well  Continue routine post partum orders  D/c instructions reviewed [facility] pp booklet given. Instructed to remove honeycomb dressing on Thursday F/u 6 wk

## 2019-01-20 NOTE — Discharge Summary (Signed)
Physician Discharge Summary  Patient ID: Kristine Singleton MRN: [id number] DOB/AGE: 24-19-1997 24 y.o.  Admit date: 01/18/2019 Discharge date: 01/20/2019  Admission Diagnoses: Induction, term gestation  Discharge Diagnoses: term gestation delivered, compound/malpresentation Principal Problem:   Postpartum care following cesarean delivery (11/21) Active Problems:   Encounter for induction of labor   S/P emergency cesarean section   Delivery by emergency cesarean   Discharged Condition: stable  Hospital Course: pt was admitted for IOL due to favorable cervix. Amniotomy done . Hand presentation with emergency C/S. See op note. Uncomplicated postoperative course  Consults: None  Significant Diagnostic Studies: labs:  CBC    Component Value Date/Time   WBC 14.0 (H) 01/19/2019 0457   RBC 3.78 (L) 01/19/2019 0457   HGB 10.5 (L) 01/19/2019 0457   HCT 32.6 (L) 01/19/2019 0457   PLT 198 01/19/2019 0457   MCV 86.2 01/19/2019 0457   MCH 27.8 01/19/2019 0457   MCHC 32.2 01/19/2019 0457   RDW 14.5 01/19/2019 0457     Treatments: surgery:  C/S( primary)  Discharge Exam: Blood pressure 96/65, pulse 88, temperature 98.2 F (36.8 C), temperature source Oral, resp. rate 16, height 5\' 5"  (1.651 m), weight 74.4 kg, SpO2 100 %, unknown if currently breastfeeding. General appearance: alert, cooperative and no distress Resp: clear to auscultation bilaterally Cardio: regular rate and rhythm, S1, S2 normal, no murmur, click, rub or gallop GI: soft uterus at umb firm NT primary dressing d/c/i Extremities: no edema, redness or tenderness in the calves or thighs  Disposition: Discharge disposition: 01-Home or Self Care       Discharge Instructions    Activity as tolerated   Complete by: As directed    Ambulatory referral to Lactation   Complete by: As directed    Reason for consult: The Mother-Infant Dyad Needs Assistance in the Continuation of Breastfeeding   Call MD for:   Complete  by: As directed    Soaking a regular maxi pad every hour or more frequently, passing large clots  For C-section:  Call if incision red, draining or increased pain. Passing clots, nausea, vomiting , severe abdominal pain   Call MD for:  temperature >100.4   Complete by: As directed    Diet general   Complete by: As directed    Discharge instructions   Complete by: As directed    For C-section: no heavy lifting(>25 lbs ) for two weeks, No driving for two weeks.   Driving restriction    Complete by: As directed    Avoid driving for at least two weeks(for C-section).   Lifting restrictions   Complete by: As directed    Weight restriction of 25 lbs.     Allergies as of 01/20/2019      Reactions   Pineapple Itching   Throat itches   Shrimp [shellfish Allergy] Rash      Medication List    TAKE these medications   ibuprofen 800 MG tablet Commonly known as: ADVIL Take 1 tablet (800 mg total) by mouth every 6 (six) hours.   oxyCODONE 5 MG immediate release tablet Commonly known as: Oxy IR/ROXICODONE Take 1-2 tablets (5-10 mg total) by mouth every 4 (four) hours as needed for moderate pain.   prenatal multivitamin Tabs tablet Take 1 tablet by mouth daily at 12 noon.   vitamin B-6 25 MG tablet Commonly known as: pyridOXINE Take 25 mg by mouth daily. Not sure of mg        Signed: [place] A Denetta Fei 01/20/2019,  12:01 PM

## 2019-01-20 NOTE — Lactation Note (Signed)
This note was copied from a baby's chart. Lactation Consultation Note  Patient Name: Kristine Singleton IWOEH'O Date: 01/20/2019 Reason for consult: Follow-up assessment   P1, Baby 50 hours old and latched upon entering in side lying position.  Discussed breastfeeding on both breasts per feeding and on demand. Mother denies questions or concerns.    Maternal Data    Feeding Feeding Type: Breast Fed  LATCH Score Latch: Grasps breast easily, tongue down, lips flanged, rhythmical sucking.  Audible Swallowing: A few with stimulation  Type of Nipple: Everted at rest and after stimulation  Comfort (Breast/Nipple): Soft / non-tender  Hold (Positioning): No assistance needed to correctly position infant at breast.  LATCH Score: 9  Interventions Interventions: Breast feeding basics reviewed  Lactation Tools Discussed/Used     Consult Status Consult Status: Follow-up Date: 01/21/19 Follow-up type: In-patient    [name] [facility] 01/20/2019, 10:58 AM

## 2019-01-20 NOTE — Discharge Instructions (Signed)
Call if temperature greater than equal to 100.4, nothing per vagina for 4-6 weeks or severe nausea vomiting, increased incisional pain , drainage or redness in the incision site, no straining with bowel movements, showers no bath °

## 2019-01-22 ENCOUNTER — Encounter (HOSPITAL_COMMUNITY): Payer: Self-pay | Admitting: Obstetrics & Gynecology

## 2020-04-24 ENCOUNTER — Encounter (HOSPITAL_COMMUNITY): Payer: Self-pay | Admitting: Emergency Medicine

## 2020-04-24 ENCOUNTER — Emergency Department (HOSPITAL_COMMUNITY): Payer: BC Managed Care – PPO

## 2020-04-24 ENCOUNTER — Other Ambulatory Visit: Payer: Self-pay

## 2020-04-24 ENCOUNTER — Emergency Department (HOSPITAL_COMMUNITY)
Admission: EM | Admit: 2020-04-24 | Discharge: 2020-04-24 | Disposition: A | Discharge status: Home / Self Care | Payer: BC Managed Care – PPO | Attending: Emergency Medicine | Admitting: Emergency Medicine

## 2020-04-24 DIAGNOSIS — W06XXXA Fall from bed, initial encounter: Secondary | ICD-10-CM | POA: Diagnosis not present

## 2020-04-24 DIAGNOSIS — S92535A Nondisplaced fracture of distal phalanx of left lesser toe(s), initial encounter for closed fracture: Secondary | ICD-10-CM | POA: Insufficient documentation

## 2020-04-24 DIAGNOSIS — Y92019 Unspecified place in single-family (private) house as the place of occurrence of the external cause: Secondary | ICD-10-CM | POA: Diagnosis not present

## 2020-04-24 DIAGNOSIS — S99922A Unspecified injury of left foot, initial encounter: Secondary | ICD-10-CM | POA: Diagnosis present

## 2020-04-24 NOTE — Discharge Instructions (Addendum)
Your 4th and 5th toes are fractured at the very tip.  These will heal on their own.  Wear the post-op shoe and use crutches as needed.  Let pain be your guide.

## 2020-04-24 NOTE — ED Triage Notes (Signed)
Patient from home, states she was moving a bed frame and it fell from her grasp, landing on toes 3, 4 and 5 on her left foot.  Some swelling and bruising noted.  Patient took two ibuprofen before coming to ED.

## 2020-04-24 NOTE — ED Provider Notes (Signed)
[facility] EMERGENCY DEPARTMENT Provider Note   CSN: [id number] Arrival date & time: 04/24/20  0106     History Chief Complaint  Patient presents with  . Toe Injury    Kristine Singleton is a 25 y.o. female.  Patient presents to the emergency department with a chief complaint of left foot pain.  She dropped a headboard on her foot tonight.  She complains of pain on her fourth and fifth toes.  She has taken Tylenol and ibuprofen with some relief.  Her symptoms are worsened with movement and palpation.  She denies any other injuries.  The history is provided by the patient. No language interpreter was used.       Past Medical History:  Diagnosis Date  . Anxiety   . Hirsutism   . Medical history non-contributory     Patient Active Problem List   Diagnosis Date Noted  . Encounter for induction of labor 01/18/2019  . S/P emergency cesarean section 01/18/2019  . Postpartum care following cesarean delivery (11/21) 01/18/2019  . Delivery by emergency cesarean 01/18/2019    Past Surgical History:  Procedure Laterality Date  . CESAREAN SECTION N/A 01/18/2019   Procedure: CESAREAN SECTION;  Surgeon: Shea Evans, MD;  Location: [facility] LD ORS;  Service: Obstetrics;  Laterality: N/A;  . NO PAST SURGERIES       OB History    Gravida  1   Para  1   Term  1   Preterm      AB      Living  1     SAB      IAB      Ectopic      Multiple  0   Live Births  1           No family history on file.  Social History   Tobacco Use  . Smoking status: Never Smoker  . Smokeless tobacco: Never Used  Vaping Use  . Vaping Use: Never used  Substance Use Topics  . Alcohol use: No  . Drug use: No    Home Medications Prior to Admission medications   Medication Sig Start Date End Date Taking? Authorizing Provider  ibuprofen (ADVIL) 800 MG tablet Take 1 tablet (800 mg total) by mouth every 6 (six) hours. 01/20/19   Maxie Better, MD  oxyCODONE  (OXY IR/ROXICODONE) 5 MG immediate release tablet Take 1-2 tablets (5-10 mg total) by mouth every 4 (four) hours as needed for moderate pain. 01/20/19   Maxie Better, MD  Prenatal Vit-Fe Fumarate-FA (PRENATAL MULTIVITAMIN) TABS tablet Take 1 tablet by mouth daily at 12 noon.    [provider]  vitamin B-6 (PYRIDOXINE) 25 MG tablet Take 25 mg by mouth daily. Not sure of mg    [provider]    Allergies    Pineapple and Shrimp [shellfish allergy]  Review of Systems   Review of Systems  All other systems reviewed and are negative.   Physical Exam Updated Vital Signs BP (!) 125/96 (BP Location: Left Arm)   Pulse 84   Temp 98.5 F (36.9 C) (Oral)   Resp 19   SpO2 100%   Breastfeeding Yes   Physical Exam Vitals and nursing note reviewed.  Constitutional:      General: She is not in acute distress.    Appearance: She is well-developed.  HENT:     Head: Normocephalic and atraumatic.  Eyes:     Conjunctiva/sclera: Conjunctivae normal.  Cardiovascular:  Rate and Rhythm: Normal rate.     Heart sounds: No murmur heard.   Pulmonary:     Effort: Pulmonary effort is normal. No respiratory distress.  Abdominal:     General: There is no distension.  Musculoskeletal:     Cervical back: Neck supple.     Comments: Ambulatory, but with antalgic gait. Moderate swelling about the left fourth and fifth toes with associated contusion, no laceration  Skin:    General: Skin is warm and dry.  Neurological:     Mental Status: She is alert and oriented to person, place, and time.  Psychiatric:        Mood and Affect: Mood normal.        Behavior: Behavior normal.     ED Results / Procedures / Treatments   Labs (all labs ordered are listed, but only abnormal results are displayed) Labs Reviewed - No data to display  EKG None  Radiology DG Foot Complete Left  Result Date: 04/24/2020 CLINICAL DATA:  Left foot pain, direct trauma EXAM: LEFT FOOT -  COMPLETE 3+ VIEW COMPARISON:  None. FINDINGS: Three view radiograph left foot demonstrates mildly displaced, minimally comminuted fractures of the distal tuft of the fourth and fifth distal phalanges. No other fracture or dislocation identified. Joint spaces are preserved. Mild soft tissue swelling surrounds the fractures involving the fourth and fifth digits. IMPRESSION: Mildly displaced and minimally comminuted fractures of the distal tuft of the fourth and fifth distal phalanges. Electronically Signed   By: Helyn Numbers MD   On: 04/24/2020 02:17    Procedures Procedures   Medications Ordered in ED Medications - No data to display  ED Course  I have reviewed the triage vital signs and the nursing notes.  Pertinent labs & imaging results that were available during my care of the patient were reviewed by me and considered in my medical decision making (see chart for details).    MDM Rules/Calculators/A&P                          Patient here with toe injury.  She has distal tuft fractures to the fourth and fifth left toes.  Patient placed in a postop shoe.  She has crutches at home, which she will use.  Recommend PCP follow-up. Final Clinical Impression(s) / ED Diagnoses Final diagnoses:  Closed nondisplaced fracture of distal phalanx of lesser toe of left foot, initial encounter    Rx / DC Orders ED Discharge Orders    None       Roxy Horseman, PA-C 04/24/20 0418    Marily Memos, MD 04/24/20 0800

## 2021-05-05 ENCOUNTER — Ambulatory Visit: Payer: Medicaid Other | Admitting: Physician Assistant

## 2021-05-05 ENCOUNTER — Other Ambulatory Visit: Payer: Self-pay

## 2021-05-05 VITALS — Ht 65.0 in | Wt 149.0 lb

## 2021-05-05 DIAGNOSIS — J0110 Acute frontal sinusitis, unspecified: Secondary | ICD-10-CM

## 2021-05-05 LAB — POC COVID19 BINAXNOW: SARS Coronavirus 2 Ag: NEGATIVE

## 2021-05-05 MED ORDER — AMOXICILLIN-POT CLAVULANATE 875-125 MG PO TABS: 1.0000 | ORAL_TABLET | Freq: Two times a day (BID) | ORAL | 0 refills | Status: DC

## 2021-05-05 MED ORDER — CETIRIZINE HCL 10 MG PO TABS: 10.0000 mg | ORAL_TABLET | Freq: Every day | ORAL | 11 refills | Status: AC

## 2021-05-05 MED ORDER — FLUTICASONE PROPIONATE 50 MCG/ACT NA SUSP: 2.0000 | Freq: Every day | NASAL | 6 refills | Status: DC

## 2021-05-05 NOTE — Progress Notes (Signed)
?Virtual Visit Consent  ? ?  ?SAMRA PESCH has provided verbal consent on 05/05/2021 for a virtual visit (telephone). ?  ?Roney Jaffe, PA-C  ? ?Date: 05/05/2021 4:14 PM ? ? ?Virtual Visit via Video Note  ? ?I, Roney Jaffe, connected with  Kristine Singleton  ([id number], 03-17-1995) on 05/05/21 at  3:40 PM EST by a telemedicine application and verified that I am speaking with the correct person using two identifiers. ? ?Location: ?Patient: Virtual Visit Location Patient: Mobile ?Provider: Virtual Visit Location Provider: Office/Clinic ?  ?I discussed the limitations of evaluation and management by telemedicine and the availability of in person appointments. The patient expressed understanding and agreed to proceed.   ? ?History of Present Illness: ?Kristine Singleton is a 26 y.o. states that she has been having pain in her left ear, pain and pressure in her sinuses, blood tinged green nasal discharge and blood-tinged green sputum with cough.  States she has also been having a sore throat, nausea from the postnasal drainage.  Endorses unmeasured fever a few days ago.  States the cough is keeping her awake.  States that her symptoms started Sunday, 5 days ago.  Denies any sick contacts. ? ?Is eating and drinking okay  ? ?States that she has tried Mucinex, Chloraseptic and cough drops without relief.   ? ?Review of Systems  ?Constitutional:  Positive for fever. Negative for chills.  ?HENT:  Positive for congestion, ear pain, sinus pain and sore throat.   ?Eyes: Negative.   ?Respiratory:  Positive for cough and sputum production. Negative for shortness of breath and wheezing.   ?Cardiovascular:  Negative for chest pain.  ?Musculoskeletal: Negative.   ?Skin: Negative.   ?Neurological:  Positive for headaches.  ?Psychiatric/Behavioral: Negative.    ? ? ?HPI: HPI  ?Problems:  ?Patient Active Problem List  ? Diagnosis Date Noted  ? Encounter for induction of labor 01/18/2019  ? S/P emergency cesarean section 01/18/2019  ?  Postpartum care following cesarean delivery (11/21) 01/18/2019  ? Delivery by emergency cesarean 01/18/2019  ?  ?Allergies:  ?Allergies  ?Allergen Reactions  ? Pineapple Itching  ?  Throat itches  ? Shrimp [Shellfish Allergy] Rash  ? ?Medications:  ?Current Outpatient Medications:  ?  amoxicillin-clavulanate (AUGMENTIN) 875-125 MG tablet, Take 1 tablet by mouth 2 (two) times daily., Disp: 20 tablet, Rfl: 0 ?  cetirizine (ZYRTEC ALLERGY) 10 MG tablet, Take 1 tablet (10 mg total) by mouth daily., Disp: 30 tablet, Rfl: 11 ?  fluticasone (FLONASE) 50 MCG/ACT nasal spray, Place 2 sprays into both nostrils daily., Disp: 16 g, Rfl: 6 ?  Prenatal Vit-Fe Fumarate-FA (PRENATAL MULTIVITAMIN) TABS tablet, Take 1 tablet by mouth daily at 12 noon., Disp: , Rfl:  ? ?Observations/Objective: ?Medical history and current medications reviewed, no physical exam completed ? ?Assessment and Plan: ?1. Acute non-recurrent frontal sinusitis ?- amoxicillin-clavulanate (AUGMENTIN) 875-125 MG tablet; Take 1 tablet by mouth 2 (two) times daily.  Dispense: 20 tablet; Refill: 0 ?- fluticasone (FLONASE) 50 MCG/ACT nasal spray; Place 2 sprays into both nostrils daily.  Dispense: 16 g; Refill: 6 ?- cetirizine (ZYRTEC ALLERGY) 10 MG tablet; Take 1 tablet (10 mg total) by mouth daily.  Dispense: 30 tablet; Refill: 11 ?- POC COVID-19 ? ?1. Acute non-recurrent frontal sinusitis ?Rapid COVID test negative.  Trial Augmentin, Flonase, Zyrtec.  Patient is currently trying to become pregnant, patient last took pregnancy test 2 weeks ago which was negative.  Patient does endorse that she has trying to  become pregnant for the last year.  Encourage patient to take pregnancy test prior to starting Augmentin.  Patient understands and agrees. ? ?Encourage patient to return to mobile unit in 1 week for physical. ? ?- amoxicillin-clavulanate (AUGMENTIN) 875-125 MG tablet; Take 1 tablet by mouth 2 (two) times daily.  Dispense: 20 tablet; Refill: 0 ?- fluticasone  (FLONASE) 50 MCG/ACT nasal spray; Place 2 sprays into both nostrils daily.  Dispense: 16 g; Refill: 6 ?- cetirizine (ZYRTEC ALLERGY) 10 MG tablet; Take 1 tablet (10 mg total) by mouth daily.  Dispense: 30 tablet; Refill: 11 ?- POC COVID-19 ? ? ?Follow Up Instructions: ?I discussed the assessment and treatment plan with the patient. The patient was provided an opportunity to ask questions and all were answered. The patient agreed with the plan and demonstrated an understanding of the instructions.  A copy of instructions were sent to the patient via MyChart unless otherwise noted below.  ? ? ? ?The patient was advised to call back or seek an in-person evaluation if the symptoms worsen or if the condition fails to improve as anticipated. ? ?Time:  ?I spent 21 minutes with the patient via telehealth technology discussing the above problems/concerns.   ? ?Alexavier Tsutsui S Mayers, PA-C ? ?

## 2021-05-05 NOTE — Progress Notes (Signed)
Pt has had food and drink today. Pt reports head ache exacerbated by coughing and red/green thick mucus.  ?

## 2021-05-05 NOTE — Patient Instructions (Signed)
?Your rapid COVID test was negative. ? ?You are going to be treated for sinusitis with Augmentin, Flonase, and Zyrtec.  Make sure that you are getting plenty of rest and drinking lots of water. ? ?I do encourage you to return to the mobile unit next week for a physical. ? ?I hope that you feel better soon, please let us know if there is any else we can do for you ? ? ?Roney Jaffe, PA-C ?Physician Assistant ?[facility] ?[email or web address] ? ?Sinusitis, Adult ?Sinusitis is inflammation of your sinuses. Sinuses are hollow spaces in the bones around your face. Your sinuses are located: ?Around your eyes. ?In the middle of your forehead. ?Behind your nose. ?In your cheekbones. ?Mucus normally drains out of your sinuses. When your nasal tissues become inflamed or swollen, mucus can become trapped or blocked. This allows bacteria, viruses, and fungi to grow, which leads to infection. Most infections of the sinuses are caused by a virus. ?Sinusitis can develop quickly. It can last for up to 4 weeks (acute) or for more than 12 weeks (chronic). Sinusitis often develops after a cold. ?What are the causes? ?This condition is caused by anything that creates swelling in the sinuses or stops mucus from draining. This includes: ?Allergies. ?Asthma. ?Infection from bacteria or viruses. ?Deformities or blockages in your nose or sinuses. ?Abnormal growths in the nose (nasal polyps). ?Pollutants, such as chemicals or irritants in the air. ?Infection from fungi (rare). ?What increases the risk? ?You are more likely to develop this condition if you: ?Have a weak body defense system (immune system). ?Do a lot of swimming or diving. ?Overuse nasal sprays. ?Smoke. ?What are the signs or symptoms? ?The main symptoms of this condition are pain and a feeling of pressure around the affected sinuses. Other symptoms include: ?Stuffy nose or congestion. ?Thick drainage from your  nose. ?Swelling and warmth over the affected sinuses. ?Headache. ?Upper toothache. ?A cough that may get worse at night. ?Extra mucus that collects in the throat or the back of the nose (postnasal drip). ?Decreased sense of smell and taste. ?Fatigue. ?A fever. ?Sore throat. ?Bad breath. ?How is this diagnosed? ?This condition is diagnosed based on: ?Your symptoms. ?Your medical history. ?A physical exam. ?Tests to find out if your condition is acute or chronic. This may include: ?Checking your nose for nasal polyps. ?Viewing your sinuses using a device that has a light (endoscope). ?Testing for allergies or bacteria. ?Imaging tests, such as an MRI or CT scan. ?In rare cases, a bone biopsy may be done to rule out more serious types of fungal sinus disease. ?How is this treated? ?Treatment for sinusitis depends on the cause and whether your condition is chronic or acute. ?If caused by a virus, your symptoms should go away on their own within 10 days. You may be given medicines to relieve symptoms. They include: ?Medicines that shrink swollen nasal passages (topical intranasal decongestants). ?Medicines that treat allergies (antihistamines). ?A spray that eases inflammation of the nostrils (topical intranasal corticosteroids). ?Rinses that help get rid of thick mucus in your nose (nasal saline washes). ?If caused by bacteria, your health care provider may recommend waiting to see if your symptoms improve. Most bacterial infections will get better without antibiotic medicine. You may be given antibiotics if you have: ?A severe infection. ?A weak immune system. ?If caused by narrow nasal passages or nasal polyps, you may need to have surgery. ?Follow these instructions at home: ?Medicines ?Take, use, or apply over-the-counter and  prescription medicines only as told by your health care provider. These may include nasal sprays. ?If you were prescribed an antibiotic medicine, take it as told by your health care provider. Do  not stop taking the antibiotic even if you start to feel better. ?Hydrate and humidify ? ?Drink enough fluid to keep your urine pale yellow. Staying hydrated will help to thin your mucus. ?Use a cool mist humidifier to keep the humidity level in your home above 50%. ?Inhale steam for 10-15 minutes, 3-4 times a day, or as told by your health care provider. You can do this in the bathroom while a hot shower is running. ?Limit your exposure to cool or dry air. ?Rest ?Rest as much as possible. ?Sleep with your head raised (elevated). ?Make sure you get enough sleep each night. ?General instructions ? ?Apply a warm, moist washcloth to your face 3-4 times a day or as told by your health care provider. This will help with discomfort. ?Wash your hands often with soap and water to reduce your exposure to germs. If soap and water are not available, use hand sanitizer. ?Do not smoke. Avoid being around people who are smoking (secondhand smoke). ?Keep all follow-up visits as told by your health care provider. This is important. ?Contact a health care provider if: ?You have a fever. ?Your symptoms get worse. ?Your symptoms do not improve within 10 days. ?Get help right away if: ?You have a severe headache. ?You have persistent vomiting. ?You have severe pain or swelling around your face or eyes. ?You have vision problems. ?You develop confusion. ?Your neck is stiff. ?You have trouble breathing. ?Summary ?Sinusitis is soreness and inflammation of your sinuses. Sinuses are hollow spaces in the bones around your face. ?This condition is caused by nasal tissues that become inflamed or swollen. The swelling traps or blocks the flow of mucus. This allows bacteria, viruses, and fungi to grow, which leads to infection. ?If you were prescribed an antibiotic medicine, take it as told by your health care provider. Do not stop taking the antibiotic even if you start to feel better. ?Keep all follow-up visits as told by your health care  provider. This is important. ?This information is not intended to replace advice given to you by your health care provider. Make sure you discuss any questions you have with your health care provider. ?Document Revised: 07/16/2017 Document Reviewed: 07/16/2017 ?Elsevier Patient Education ? 2022 Elsevier Inc. ? ? ?

## 2021-07-14 ENCOUNTER — Ambulatory Visit: Payer: Self-pay | Admitting: *Deleted

## 2021-07-14 NOTE — Telephone Encounter (Signed)
  Chief Complaint: rash on both arms and neck that is itching real bad. Symptoms: patches of rash that are popping up, healing then others pop up. Frequency: For 2 weeks Pertinent Negatives: Patient denies drainage or pain from rash Disposition: [] ED /[x] Urgent Care (no appt availability in office) / [] Appointment(In office/virtual)/ []  [facility]/ [] Home Care/ [] Refused Recommended Disposition /[] [facility]/ []  Follow-up with PCP Additional Notes: Recommended urgent care since new pt appt isn't until Aug. At [facility].

## 2021-07-14 NOTE — Telephone Encounter (Signed)
Reason for Disposition  Localized rash present > 7 days  Answer Assessment - Initial Assessment Questions 1. APPEARANCE of RASH: "Describe the rash."      Arms and neck I have a rash. In little groups.   Small patches of rash 2. LOCATION: "Where is the rash located?"      See above 3. NUMBER: "How many spots are there?"       4. SIZE: "How big are the spots?" (Inches, centimeters or compare to size of a coin)       5. ONSET: "When did the rash start?"      2 weeks and getting worse 6. ITCHING: "Does the rash itch?" If Yes, ask: "How bad is the itch?"  (Scale 0-10; or none, mild, moderate, severe)     Itches pretty bad I tried Benadryl and nothing is helping.    7. PAIN: "Does the rash hurt?" If Yes, ask: "How bad is the pain?"  (Scale 0-10; or none, mild, moderate, severe)    - NONE (0): no pain    - MILD (1-3): doesn't interfere with normal activities     - MODERATE (4-7): interferes with normal activities or awakens from sleep     - SEVERE (8-10): excruciating pain, unable to do any normal activities     My arms are scabbed where some have healed and other places are new and just popping up. 8. OTHER SYMPTOMS: "Do you have any other symptoms?" (e.g., fever)     No just itching. 9. PREGNANCY: "Is there any chance you are pregnant?" "When was your last menstrual period?"     Not asked  Protocols used: Rash or Redness - Localized-A-AH

## 2021-10-17 ENCOUNTER — Ambulatory Visit (INDEPENDENT_AMBULATORY_CARE_PROVIDER_SITE_OTHER): Payer: Medicaid Other | Admitting: Primary Care

## 2022-02-27 NOTE — L&D Delivery Note (Signed)
   Delivery Note:   G2P1001 at [redacted]w[redacted]d  Admitting diagnosis: Normal labor [O80, Z37.9] Risks: Fetal renal pyelectasis, GBS positive, TOLAC Onset of labor: 02/08/2023 at 1835 IOL/Augmentation: AROM, Pitocin, and OP Foley ROM: 02/08/2023 at 1836, clear fluid  Complete dilation at 02/09/2023 0604 Onset of pushing at 0640 FHR second stage Cat I  Analgesia/Anesthesia intrapartum:Epidural Pushing in lithotomy position with CNM and L&D staff support. Husband, Rande Brunt, present for birth and supportive.  Delivery of a Live born female  Birth Weight:  pending APGAR: 9, 9  Newborn Delivery   Birth date/time: 02/09/2023 06:55:00 Delivery type: Vaginal, Spontaneous    in cephalic presentation, position OA to LOA.  APGAR:1 min-9 , 5 min-9   Nuchal Cord: No  Cord double clamped after cessation of pulsation, cut by [facility].  Collection of cord blood for typing completed. Arterial cord blood sample-No   Placenta delivered-Spontaneous with 3 vessels. Uterotonics: Pitocin Placenta to L&D Uterine tone firm  Bleeding small  2nd degree laceration identified.  Episiotomy:None Local analgesia: N/A  Repair: 3-0 in usual fashion Est. Blood Loss (mL):246.00  Complications: Intrauterine Inflammation or infection (Chorioamniotis)  Will treat with Ampicillin and gentamicin for 24 hours postpartum.   Mom to postpartum. Baby Naina to Couplet care / Skin to Skin.  Delivery Report:  Review the Delivery Report for details.    June Leap, CNM, MSN 02/09/2023, 7:26 AM

## 2022-04-12 IMAGING — CR DG FOOT COMPLETE 3+V*L*
3 series · 3 of 3 positions shown · non-contrast
Comparison: None.

CLINICAL DATA: Left foot pain, direct trauma

EXAM:
LEFT FOOT - COMPLETE 3+ VIEW

[foot ap]
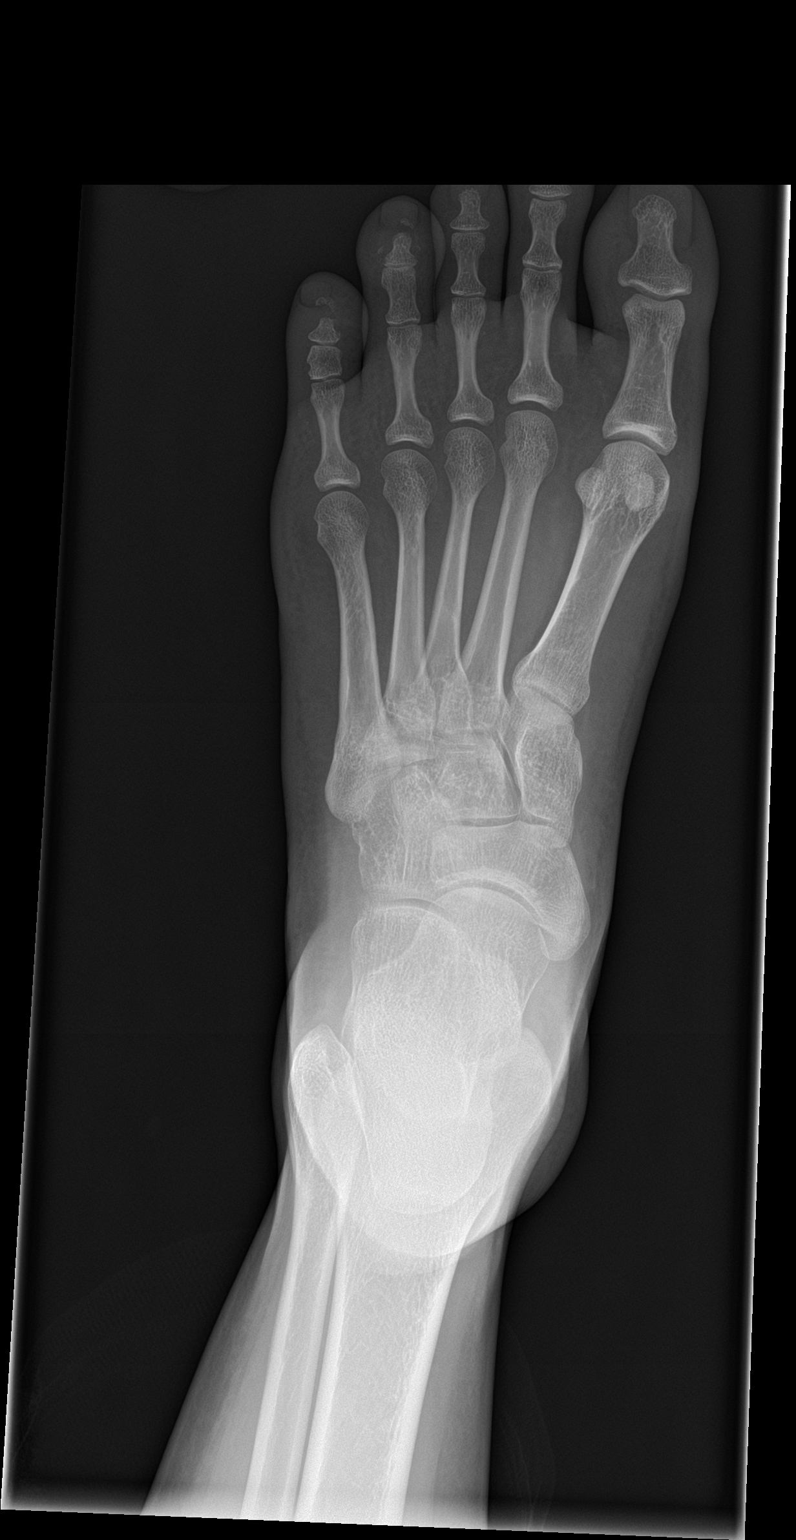

[foot obl]
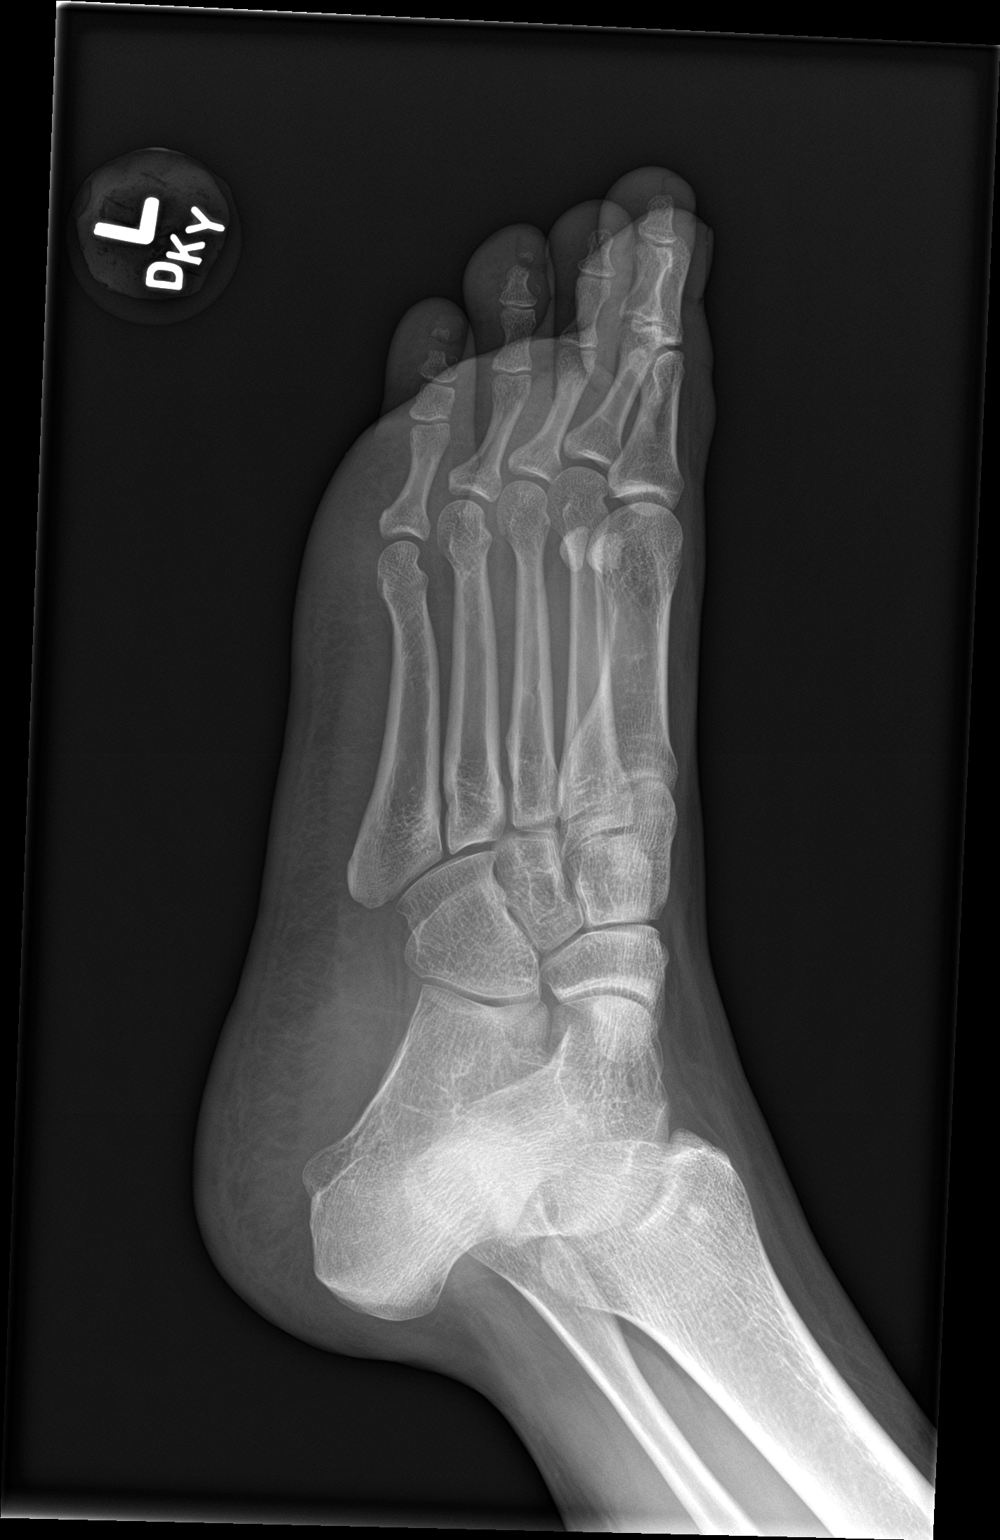

[foot lat]
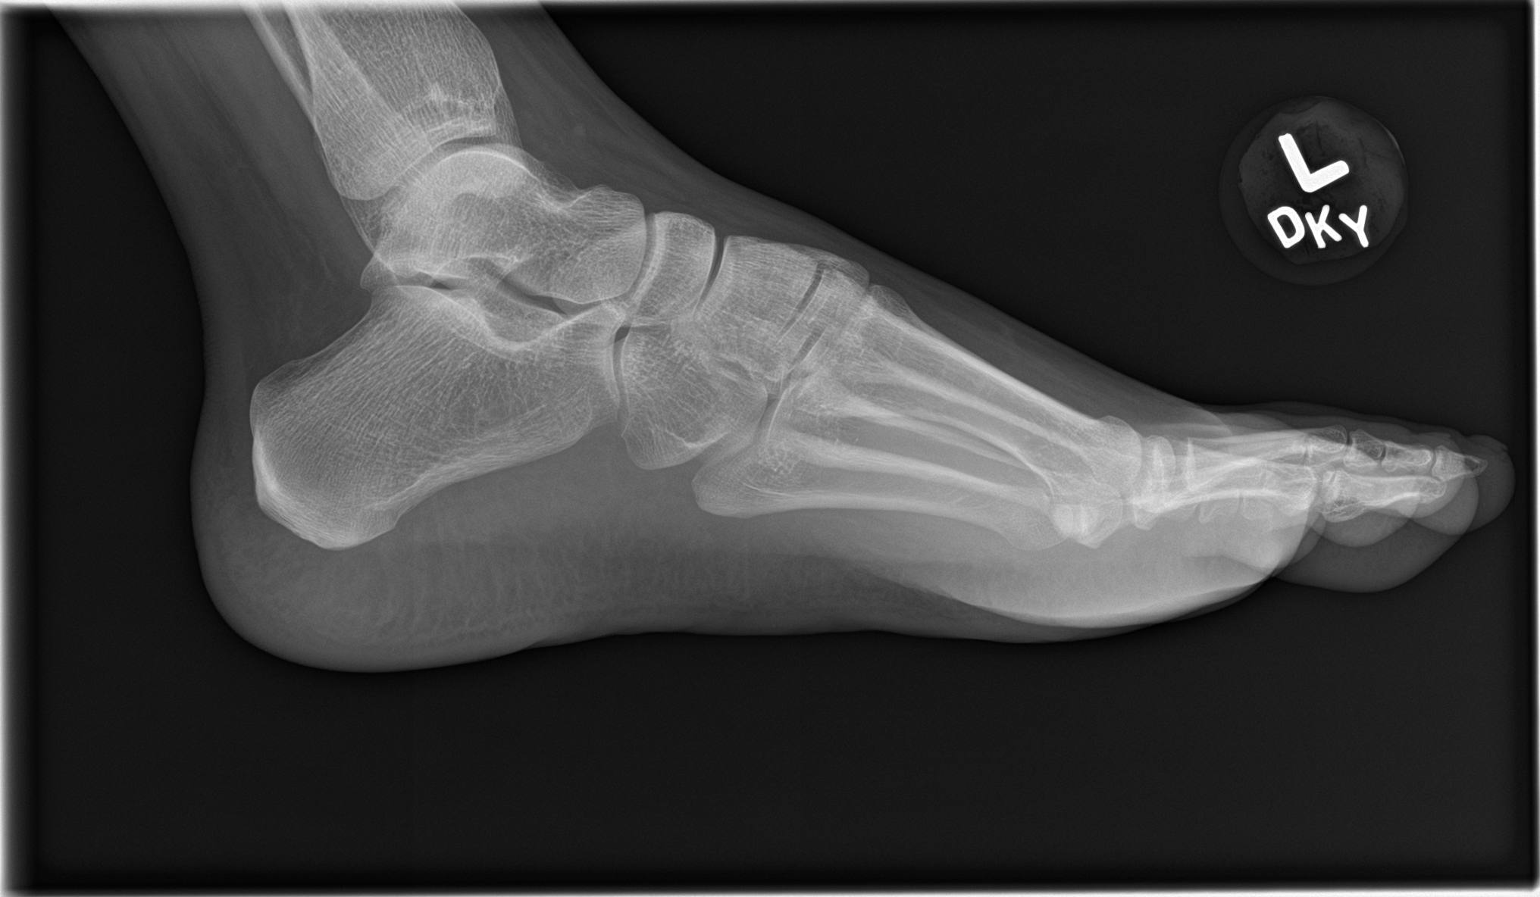

[3 of 3 positions shown; findings below may reference images not displayed]

FINDINGS: Three view radiograph left foot demonstrates mildly displaced,
minimally comminuted fractures of the distal tuft of the fourth and
fifth distal phalanges. No other fracture or dislocation identified.
Joint spaces are preserved. Mild soft tissue swelling surrounds the
fractures involving the fourth and fifth digits.
IMPRESSION: Mildly displaced and minimally comminuted fractures of the distal
tuft of the fourth and fifth distal phalanges.

## 2022-07-17 LAB — OB RESULTS CONSOLE GC/CHLAMYDIA
Chlamydia: NEGATIVE
Neisseria Gonorrhea: NEGATIVE

## 2022-07-28 LAB — OB RESULTS CONSOLE HEPATITIS B SURFACE ANTIGEN: Hepatitis B Surface Ag: NEGATIVE

## 2022-07-28 LAB — OB RESULTS CONSOLE RPR: RPR: NONREACTIVE

## 2022-07-28 LAB — OB RESULTS CONSOLE RUBELLA ANTIBODY, IGM: Rubella: IMMUNE

## 2022-07-28 LAB — OB RESULTS CONSOLE ANTIBODY SCREEN: Antibody Screen: NEGATIVE

## 2022-07-28 LAB — OB RESULTS CONSOLE HIV ANTIBODY (ROUTINE TESTING): HIV: NONREACTIVE

## 2022-07-28 LAB — HEPATITIS C ANTIBODY: HCV Ab: NEGATIVE

## 2022-12-06 ENCOUNTER — Other Ambulatory Visit: Payer: Self-pay | Admitting: Certified Nurse Midwife

## 2022-12-06 ENCOUNTER — Telehealth: Payer: Self-pay

## 2022-12-06 DIAGNOSIS — O43193 Other malformation of placenta, third trimester: Secondary | ICD-10-CM

## 2022-12-11 ENCOUNTER — Encounter: Payer: Self-pay | Admitting: *Deleted

## 2022-12-11 ENCOUNTER — Other Ambulatory Visit: Payer: Self-pay

## 2022-12-11 DIAGNOSIS — O35EXX0 Maternal care for other (suspected) fetal abnormality and damage, fetal genitourinary anomalies, not applicable or unspecified: Secondary | ICD-10-CM | POA: Insufficient documentation

## 2022-12-11 DIAGNOSIS — O34219 Maternal care for unspecified type scar from previous cesarean delivery: Secondary | ICD-10-CM | POA: Insufficient documentation

## 2022-12-11 DIAGNOSIS — O09299 Supervision of pregnancy with other poor reproductive or obstetric history, unspecified trimester: Secondary | ICD-10-CM | POA: Insufficient documentation

## 2022-12-11 DIAGNOSIS — O43103 Malformation of placenta, unspecified, third trimester: Secondary | ICD-10-CM | POA: Insufficient documentation

## 2022-12-13 ENCOUNTER — Encounter: Payer: Self-pay | Admitting: *Deleted

## 2022-12-13 ENCOUNTER — Ambulatory Visit: Payer: 59 | Attending: Certified Nurse Midwife

## 2022-12-13 ENCOUNTER — Other Ambulatory Visit: Payer: Self-pay | Admitting: *Deleted

## 2022-12-13 ENCOUNTER — Ambulatory Visit: Payer: 59 | Admitting: *Deleted

## 2022-12-13 ENCOUNTER — Other Ambulatory Visit: Payer: Self-pay

## 2022-12-13 VITALS — BP 93/57 | HR 84

## 2022-12-13 DIAGNOSIS — O34219 Maternal care for unspecified type scar from previous cesarean delivery: Secondary | ICD-10-CM | POA: Diagnosis not present

## 2022-12-13 DIAGNOSIS — O35EXX0 Maternal care for other (suspected) fetal abnormality and damage, fetal genitourinary anomalies, not applicable or unspecified: Secondary | ICD-10-CM

## 2022-12-13 DIAGNOSIS — O09299 Supervision of pregnancy with other poor reproductive or obstetric history, unspecified trimester: Secondary | ICD-10-CM

## 2022-12-13 DIAGNOSIS — Z362 Encounter for other antenatal screening follow-up: Secondary | ICD-10-CM

## 2022-12-13 DIAGNOSIS — O43103 Malformation of placenta, unspecified, third trimester: Secondary | ICD-10-CM

## 2022-12-13 DIAGNOSIS — O43193 Other malformation of placenta, third trimester: Secondary | ICD-10-CM | POA: Diagnosis present

## 2022-12-13 DIAGNOSIS — Z3A31 31 weeks gestation of pregnancy: Secondary | ICD-10-CM

## 2022-12-28 ENCOUNTER — Encounter: Payer: Self-pay | Admitting: *Deleted

## 2022-12-28 DIAGNOSIS — O358XX0 Maternal care for other (suspected) fetal abnormality and damage, not applicable or unspecified: Secondary | ICD-10-CM | POA: Insufficient documentation

## 2023-01-11 ENCOUNTER — Ambulatory Visit: Payer: 59 | Attending: Maternal & Fetal Medicine

## 2023-01-11 ENCOUNTER — Other Ambulatory Visit: Payer: Self-pay

## 2023-01-11 DIAGNOSIS — Z362 Encounter for other antenatal screening follow-up: Secondary | ICD-10-CM | POA: Diagnosis present

## 2023-01-11 DIAGNOSIS — O34219 Maternal care for unspecified type scar from previous cesarean delivery: Secondary | ICD-10-CM | POA: Diagnosis not present

## 2023-01-11 DIAGNOSIS — O43103 Malformation of placenta, unspecified, third trimester: Secondary | ICD-10-CM

## 2023-01-11 DIAGNOSIS — Z3A35 35 weeks gestation of pregnancy: Secondary | ICD-10-CM | POA: Diagnosis not present

## 2023-01-11 DIAGNOSIS — O35EXX0 Maternal care for other (suspected) fetal abnormality and damage, fetal genitourinary anomalies, not applicable or unspecified: Secondary | ICD-10-CM | POA: Diagnosis present

## 2023-01-12 ENCOUNTER — Other Ambulatory Visit: Payer: Self-pay | Admitting: *Deleted

## 2023-01-12 DIAGNOSIS — O35EXX0 Maternal care for other (suspected) fetal abnormality and damage, fetal genitourinary anomalies, not applicable or unspecified: Secondary | ICD-10-CM

## 2023-01-14 ENCOUNTER — Inpatient Hospital Stay (HOSPITAL_COMMUNITY)
Admission: AD | Admit: 2023-01-14 | Discharge: 2023-01-14 | Disposition: A | Discharge status: Home / Self Care | Payer: 59 | Attending: Obstetrics | Admitting: Obstetrics

## 2023-01-14 ENCOUNTER — Encounter (HOSPITAL_COMMUNITY): Payer: Self-pay | Admitting: Obstetrics

## 2023-01-14 DIAGNOSIS — Z3A35 35 weeks gestation of pregnancy: Secondary | ICD-10-CM | POA: Diagnosis not present

## 2023-01-14 DIAGNOSIS — Z3689 Encounter for other specified antenatal screening: Secondary | ICD-10-CM

## 2023-01-14 DIAGNOSIS — O4703 False labor before 37 completed weeks of gestation, third trimester: Secondary | ICD-10-CM | POA: Diagnosis not present

## 2023-01-14 DIAGNOSIS — O26893 Other specified pregnancy related conditions, third trimester: Secondary | ICD-10-CM | POA: Insufficient documentation

## 2023-01-14 LAB — URINALYSIS, ROUTINE W REFLEX MICROSCOPIC
Bilirubin Urine: NEGATIVE
Glucose, UA: NEGATIVE mg/dL
Hgb urine dipstick: NEGATIVE
Ketones, ur: 5 mg/dL — AB
Leukocytes,Ua: NEGATIVE
Nitrite: NEGATIVE
Protein, ur: NEGATIVE mg/dL
Specific Gravity, Urine: 1.012 (ref 1.005–1.030)
pH: 6 (ref 5.0–8.0)

## 2023-01-14 LAB — WET PREP, GENITAL
Clue Cells Wet Prep HPF POC: NONE SEEN
Sperm: NONE SEEN
Trich, Wet Prep: NONE SEEN
WBC, Wet Prep HPF POC: <10 (ref <10)
Yeast Wet Prep HPF POC: NONE SEEN

## 2023-01-14 MED ORDER — NIFEDIPINE 10 MG PO CAPS
10.0000 mg | ORAL_CAPSULE | ORAL | Status: DC | PRN
Start: 2023-01-14 — End: 2023-01-14
  Administered 2023-01-14 (×2): 10 mg via ORAL
  Filled 2023-01-14 (×2): qty 1

## 2023-01-14 NOTE — MAU Provider Note (Signed)
History     CSN: [id number]  Arrival date and time: 01/14/23 6962   Event Date/Time   First Provider Initiated Contact with Patient 01/14/23 0249      Chief Complaint  Patient presents with   Abdominal Pain    Kristine Singleton is a 27 y.o. G2P1001 at [redacted]w[redacted]d who receives care at [facility].  Patient reports her next appt is Wednesday Nov 20th.   She presents today for abdominal cramping.  She states the cramping started around 2330 and was mild until about midnight.  She states it is currently 4-5/10 and "never worse than that."  Patient endorses fetal movement. She denies vaginal concerns.    OB History     Gravida  2   Para  1   Term  1   Preterm      AB      Living  1      SAB      IAB      Ectopic      Multiple  0   Live Births  1           Past Medical History:  Diagnosis Date   Anxiety    Hirsutism     Past Surgical History:  Procedure Laterality Date   CESAREAN SECTION N/A 01/18/2019   Procedure: CESAREAN SECTION;  Surgeon: Shea Evans, MD;  Location: [facility] LD ORS;  Service: Obstetrics;  Laterality: N/A;    Family History  Problem Relation Age of Onset   Diabetes Neg Hx    Hypertension Neg Hx     Social History   Tobacco Use   Smoking status: Never   Smokeless tobacco: Never  Vaping Use   Vaping status: Never Used  Substance Use Topics   Alcohol use: No   Drug use: No    Allergies:  Allergies  Allergen Reactions   Pineapple Itching    Throat itches   Shrimp [Shellfish Allergy] Rash    Medications Prior to Admission  Medication Sig Dispense Refill Last Dose   Prenatal Vit-Fe Fumarate-FA (PRENATAL MULTIVITAMIN) TABS tablet Take 1 tablet by mouth daily at 12 noon.   01/13/2023   cetirizine (ZYRTEC ALLERGY) 10 MG tablet Take 1 tablet (10 mg total) by mouth daily. (Patient not taking: Reported on 12/13/2022) 30 tablet 11    famotidine (PEPCID) 20 MG tablet Take 20 mg by mouth 2 (two) times daily.       Review of  Systems  Gastrointestinal:  Positive for abdominal pain (Cramping), constipation (Pregnancy) and nausea (None currently). Negative for vomiting.  Genitourinary:  Negative for difficulty urinating, dyspareunia, dysuria, vaginal bleeding and vaginal discharge.  Neurological:  Negative for dizziness, light-headedness and headaches.   Physical Exam   Blood pressure 112/75, pulse 87, temperature 98.4 F (36.9 C), temperature source Oral, resp. rate 16, height 5\' 5"  (1.651 m), weight 75 kg, last menstrual period 05/10/2022, SpO2 100%, currently breastfeeding.  Physical Exam Vitals and nursing note reviewed. Exam conducted with a chaperone present (Dea, Charity fundraiser).  Constitutional:      Appearance: Normal appearance. She is well-developed.  HENT:     Head: Normocephalic and atraumatic.  Eyes:     Conjunctiva/sclera: Conjunctivae normal.  Cardiovascular:     Rate and Rhythm: Normal rate and regular rhythm.  Pulmonary:     Effort: Pulmonary effort is normal. No respiratory distress.  Abdominal:     Palpations: Abdomen is soft.     Tenderness: There is no abdominal tenderness.  Genitourinary:  General: Normal vulva.     Comments: Gravid, Appears AGA Cultures collected blindly.  Dilation: Closed Effacement (%): Thick Cervical Position: Posterior Station: Ballotable Presentation: Undeterminable Exam by:: Gerrit Heck CNM  Musculoskeletal:        General: Normal range of motion.     Cervical back: Normal range of motion.  Skin:    General: Skin is warm and dry.  Neurological:     Mental Status: She is alert and oriented to person, place, and time.  Psychiatric:        Mood and Affect: Mood normal.        Behavior: Behavior normal.     Fetal Assessment 130 bpm, Mod Var, -Decels, +Accels Toco: Q1-94min  MAU Course   Results for orders placed or performed during the hospital encounter of 01/14/23 (from the past 24 hour(s))  Urinalysis, Routine w reflex microscopic -Urine, Clean  Catch     Status: Abnormal   Collection Time: 01/14/23  3:05 AM  Result Value Ref Range   Color, Urine YELLOW YELLOW   APPearance CLOUDY (A) CLEAR   Specific Gravity, Urine 1.012 1.005 - 1.030   pH 6.0 5.0 - 8.0   Glucose, UA NEGATIVE NEGATIVE mg/dL   Hgb urine dipstick NEGATIVE NEGATIVE   Bilirubin Urine NEGATIVE NEGATIVE   Ketones, ur 5 (A) NEGATIVE mg/dL   Protein, ur NEGATIVE NEGATIVE mg/dL   Nitrite NEGATIVE NEGATIVE   Leukocytes,Ua NEGATIVE NEGATIVE  Wet prep, genital     Status: None   Collection Time: 01/14/23  3:05 AM   Specimen: Vaginal  Result Value Ref Range   Yeast Wet Prep HPF POC NONE SEEN NONE SEEN   Trich, Wet Prep NONE SEEN NONE SEEN   Clue Cells Wet Prep HPF POC NONE SEEN NONE SEEN   WBC, Wet Prep HPF POC <10 <10   Sperm NONE SEEN    No results found.  MDM PE Labs: Wet Prep, UA EFM  Assessment and Plan  27 year old G2P1001  SIUP at 35.4 weeks Cat I FT Contractions  -POC Reviewed -Exam performed and findings discussed. -Cultures collected blindly. -NST reactive. -Discussed recommendation for procardia if contractions continue.  -Patient offered and declines pain medication as she expresses desire to avoid medications.  -Further states she is not uncomfortable with contractions.  -Give PO hydration. -Monitor and reassess.   Cherre Robins MSN, CNM 01/14/2023, 2:49 AM   Reassessment (4:15 AM) -Patient reports continued perception of contractions. -Educated on procardia usage.  -Patient agreeable to dosing for improved comfort. -Procardia ordered per protocol. -Encouraged to continue oral hydration. -Monitor and reassess.   Reassessment (5:17 AM) -Patient reports contractions have improved with procardia x 2. -Tocometry has improved and now showing more irritability. -Patient requesting discharge. -Nurse to review precautions. -Instructed to keep next appt as scheduled.  -Encouraged to call primary office or return to MAU if symptoms  worsen or with the onset of new symptoms. -Discharged to home in stable condition.  Cherre Robins MSN, CNM Advanced Practice Provider, [facility] [employer]

## 2023-01-14 NOTE — MAU Note (Signed)
..  Kristine Singleton is a 27 y.o. at [redacted]w[redacted]d here in MAU reporting: lower abdominal cramping that began around 11pm that comes and goes Denies vaginal bleeding or leaking of fluid.  Reports baby's movements have been rough.   Pain score: 4/10 Vitals:   01/14/23 0217  BP: 112/75  Pulse: 87  Resp: 16  Temp: 98.4 F (36.9 C)  SpO2: 100%     [id number] in room 120's Lab orders placed from triage:  UA

## 2023-01-15 LAB — GC/CHLAMYDIA PROBE AMP (~~LOC~~) NOT AT ARMC
Chlamydia: NEGATIVE
Comment: NEGATIVE
Comment: NORMAL
Neisseria Gonorrhea: NEGATIVE

## 2023-01-17 LAB — OB RESULTS CONSOLE GBS: GBS: POSITIVE

## 2023-01-31 ENCOUNTER — Telehealth (HOSPITAL_COMMUNITY): Payer: Self-pay | Admitting: *Deleted

## 2023-01-31 NOTE — Telephone Encounter (Signed)
Preadmission screen  

## 2023-02-05 ENCOUNTER — Encounter (HOSPITAL_COMMUNITY): Payer: Self-pay

## 2023-02-05 ENCOUNTER — Ambulatory Visit: Payer: 59

## 2023-02-07 ENCOUNTER — Telehealth (HOSPITAL_COMMUNITY): Payer: Self-pay | Admitting: *Deleted

## 2023-02-07 NOTE — Telephone Encounter (Signed)
Preadmission screen  

## 2023-02-08 ENCOUNTER — Encounter (HOSPITAL_COMMUNITY): Payer: Self-pay | Admitting: Obstetrics and Gynecology

## 2023-02-08 ENCOUNTER — Inpatient Hospital Stay (HOSPITAL_COMMUNITY): Payer: 59 | Admitting: Anesthesiology

## 2023-02-08 ENCOUNTER — Inpatient Hospital Stay (HOSPITAL_COMMUNITY): Admission: RE | Admit: 2023-02-08 | Payer: 59 | Source: Home / Self Care | Admitting: Obstetrics & Gynecology

## 2023-02-08 ENCOUNTER — Inpatient Hospital Stay (HOSPITAL_COMMUNITY): Payer: 59 | Attending: Certified Nurse Midwife

## 2023-02-08 ENCOUNTER — Inpatient Hospital Stay (HOSPITAL_COMMUNITY)
Admission: AD | Admit: 2023-02-08 | Discharge: 2023-02-10 | DRG: 805 | Disposition: A | Discharge status: Home / Self Care | Payer: 59 | Attending: Obstetrics and Gynecology | Admitting: Obstetrics and Gynecology

## 2023-02-08 DIAGNOSIS — Z3A39 39 weeks gestation of pregnancy: Secondary | ICD-10-CM | POA: Diagnosis not present

## 2023-02-08 DIAGNOSIS — Z91013 Allergy to seafood: Secondary | ICD-10-CM

## 2023-02-08 DIAGNOSIS — O34211 Maternal care for low transverse scar from previous cesarean delivery: Principal | ICD-10-CM | POA: Diagnosis present

## 2023-02-08 DIAGNOSIS — O35EXX0 Maternal care for other (suspected) fetal abnormality and damage, fetal genitourinary anomalies, not applicable or unspecified: Secondary | ICD-10-CM | POA: Diagnosis present

## 2023-02-08 DIAGNOSIS — O26893 Other specified pregnancy related conditions, third trimester: Secondary | ICD-10-CM | POA: Diagnosis present

## 2023-02-08 DIAGNOSIS — O9962 Diseases of the digestive system complicating childbirth: Secondary | ICD-10-CM | POA: Diagnosis present

## 2023-02-08 DIAGNOSIS — O99824 Streptococcus B carrier state complicating childbirth: Secondary | ICD-10-CM | POA: Diagnosis present

## 2023-02-08 DIAGNOSIS — O411230 Chorioamnionitis, third trimester, not applicable or unspecified: Secondary | ICD-10-CM | POA: Diagnosis present

## 2023-02-08 DIAGNOSIS — K219 Gastro-esophageal reflux disease without esophagitis: Secondary | ICD-10-CM | POA: Diagnosis present

## 2023-02-08 DIAGNOSIS — B951 Streptococcus, group B, as the cause of diseases classified elsewhere: Secondary | ICD-10-CM | POA: Diagnosis present

## 2023-02-08 DIAGNOSIS — O43103 Malformation of placenta, unspecified, third trimester: Secondary | ICD-10-CM | POA: Diagnosis present

## 2023-02-08 DIAGNOSIS — O411290 Chorioamnionitis, unspecified trimester, not applicable or unspecified: Secondary | ICD-10-CM | POA: Diagnosis not present

## 2023-02-08 DIAGNOSIS — O34219 Maternal care for unspecified type scar from previous cesarean delivery: Secondary | ICD-10-CM | POA: Diagnosis present

## 2023-02-08 LAB — TYPE AND SCREEN
ABO/RH(D): B POS
Antibody Screen: NEGATIVE

## 2023-02-08 LAB — CBC
HCT: 38.5 % (ref 36.0–46.0)
Hemoglobin: 12.9 g/dL (ref 12.0–15.0)
MCH: 29.6 pg (ref 26.0–34.0)
MCHC: 33.5 g/dL (ref 30.0–36.0)
MCV: 88.3 fL (ref 80.0–100.0)
Platelets: 218 10*3/uL (ref 150–400)
RBC: 4.36 MIL/uL (ref 3.87–5.11)
RDW: 14 % (ref 11.5–15.5)
WBC: 12.6 10*3/uL — ABNORMAL HIGH (ref 4.0–10.5)
nRBC: 0 % (ref 0.0–0.2)

## 2023-02-08 MED ORDER — SODIUM CHLORIDE 0.9 % IV SOLN: 250.0000 mL | INTRAVENOUS | Status: AC | PRN

## 2023-02-08 MED ORDER — FENTANYL-BUPIVACAINE-NACL 0.5-0.125-0.9 MG/250ML-% EP SOLN
12.0000 mL/h | EPIDURAL | Status: DC | PRN
  Administered 2023-02-08: 12 mL/h via EPIDURAL
  Filled 2023-02-08: qty 250

## 2023-02-08 MED ORDER — LACTATED RINGERS IV SOLN
500.0000 mL | INTRAVENOUS | Status: DC | PRN
  Administered 2023-02-09: 500 mL via INTRAVENOUS

## 2023-02-08 MED ORDER — OXYCODONE-ACETAMINOPHEN 5-325 MG PO TABS: 1.0000 | ORAL_TABLET | ORAL | Status: DC | PRN

## 2023-02-08 MED ORDER — OXYTOCIN BOLUS FROM INFUSION
333.0000 mL | Freq: Once | INTRAVENOUS | Status: AC
  Administered 2023-02-09: 333 mL via INTRAVENOUS

## 2023-02-08 MED ORDER — LIDOCAINE HCL (PF) 1 % IJ SOLN: 30.0000 mL | INTRAMUSCULAR | Status: DC | PRN

## 2023-02-08 MED ORDER — FENTANYL CITRATE (PF) 100 MCG/2ML IJ SOLN
INTRAMUSCULAR | Status: AC
  Administered 2023-02-08: 100 ug via INTRAVENOUS
  Filled 2023-02-08: qty 2

## 2023-02-08 MED ORDER — DIPHENHYDRAMINE HCL 50 MG/ML IJ SOLN: 12.5000 mg | INTRAMUSCULAR | Status: DC | PRN

## 2023-02-08 MED ORDER — BUPIVACAINE HCL (PF) 0.25 % IJ SOLN
INTRAMUSCULAR | Status: DC | PRN
  Administered 2023-02-08 (×2): 5 mL via EPIDURAL

## 2023-02-08 MED ORDER — SODIUM CHLORIDE 0.9% FLUSH
3.0000 mL | INTRAVENOUS | Status: DC | PRN
Start: 2023-02-08 — End: 2023-02-10
  Administered 2023-02-08: 3 mL via INTRAVENOUS

## 2023-02-08 MED ORDER — ONDANSETRON HCL 4 MG/2ML IJ SOLN
4.0000 mg | Freq: Four times a day (QID) | INTRAMUSCULAR | Status: DC | PRN
Start: 2023-02-08 — End: 2023-02-09
  Administered 2023-02-09: 4 mg via INTRAVENOUS
  Filled 2023-02-08: qty 2

## 2023-02-08 MED ORDER — ACETAMINOPHEN 325 MG PO TABS
650.0000 mg | ORAL_TABLET | ORAL | Status: DC | PRN
Start: 2023-02-08 — End: 2023-02-09

## 2023-02-08 MED ORDER — OXYTOCIN-SODIUM CHLORIDE 30-0.9 UT/500ML-% IV SOLN
2.5000 [IU]/h | INTRAVENOUS | Status: DC
  Administered 2023-02-09: 2.5 [IU]/h via INTRAVENOUS
  Filled 2023-02-08: qty 500

## 2023-02-08 MED ORDER — LACTATED RINGERS IV SOLN: INTRAVENOUS | Status: AC

## 2023-02-08 MED ORDER — SODIUM CHLORIDE 0.9% FLUSH: 3.0000 mL | Freq: Two times a day (BID) | INTRAVENOUS | Status: DC

## 2023-02-08 MED ORDER — SOD CITRATE-CITRIC ACID 500-334 MG/5ML PO SOLN
30.0000 mL | ORAL | Status: DC | PRN
Start: 2023-02-08 — End: 2023-02-09
  Filled 2023-02-08: qty 30

## 2023-02-08 MED ORDER — PHENYLEPHRINE 80 MCG/ML (10ML) SYRINGE FOR IV PUSH (FOR BLOOD PRESSURE SUPPORT)
80.0000 ug | PREFILLED_SYRINGE | INTRAVENOUS | Status: DC | PRN
  Administered 2023-02-09: 80 ug via INTRAVENOUS

## 2023-02-08 MED ORDER — LACTATED RINGERS IV SOLN
500.0000 mL | Freq: Once | INTRAVENOUS | Status: AC
  Administered 2023-02-08: 500 mL via INTRAVENOUS

## 2023-02-08 MED ORDER — CALCIUM CARBONATE ANTACID 500 MG PO CHEW: 1.0000 | CHEWABLE_TABLET | ORAL | Status: DC | PRN

## 2023-02-08 MED ORDER — PENICILLIN G POT IN DEXTROSE 60000 UNIT/ML IV SOLN
3.0000 10*6.[IU] | INTRAVENOUS | Status: DC
  Administered 2023-02-08 (×2): 3 10*6.[IU] via INTRAVENOUS
  Filled 2023-02-08 (×2): qty 50

## 2023-02-08 MED ORDER — SODIUM CHLORIDE 0.9 % IV SOLN
5.0000 10*6.[IU] | Freq: Once | INTRAVENOUS | Status: AC
  Administered 2023-02-08: 5 10*6.[IU] via INTRAVENOUS
  Filled 2023-02-08: qty 5

## 2023-02-08 MED ORDER — OXYCODONE-ACETAMINOPHEN 5-325 MG PO TABS: 2.0000 | ORAL_TABLET | ORAL | Status: DC | PRN

## 2023-02-08 MED ORDER — FENTANYL CITRATE (PF) 100 MCG/2ML IJ SOLN: 100.0000 ug | INTRAMUSCULAR | Status: DC | PRN

## 2023-02-08 MED ORDER — EPHEDRINE 5 MG/ML INJ: 10.0000 mg | INTRAVENOUS | Status: DC | PRN

## 2023-02-08 MED ORDER — PHENYLEPHRINE 80 MCG/ML (10ML) SYRINGE FOR IV PUSH (FOR BLOOD PRESSURE SUPPORT)
80.0000 ug | PREFILLED_SYRINGE | INTRAVENOUS | Status: DC | PRN
Start: 2023-02-08 — End: 2023-02-09
  Filled 2023-02-08: qty 10

## 2023-02-08 NOTE — H&P (Signed)
OB ADMISSION/ HISTORY & PHYSICAL:  Admission Date: 02/08/2023 12:12 PM  Admit Diagnosis: Normal labor [O80, Z37.9]    Kristine Singleton is a 27 y.o. female G2P1001 at [redacted]w[redacted]d presenting for labor. Patient was a scheduled IOL this AM but was postponed due to staffing. Presented to the office for a labor check this AM and was 6cm and in latent labor. GBS is positive and decision was made to admit for PCN until room is available on L&D. Planning unmedicated TOLAC. Reports + bloody show. Denies leaking of fluid. Husband, Rande Brunt, present and supportive. Doula will join in active labor. Eagerly anticipating baby girl "Naina".   Prenatal History: G2P1001   EDC: 02/15/2023 Prenatal care at [facility] since 9 weeks  Primary: A. Yetta Barre, CNM  Prenatal course complicated by: History of PLTCS for hand presentation with Dr. Juliene Pina Fetal renal pyelectasis, followed by MFM, last U/S with RRP 12.48mm Placental lakes noted on U/S, cleared by MFM for vaginal birth trial Infertility, conceived with Femara History of depression, stable off medication GBS positive, plan IP antibiotics  Prenatal Labs: ABO, Rh:   B POS Antibody: Negative (05/31 0000) Rubella: Immune (05/31 0000)  RPR: Nonreactive (05/31 0000)  HBsAg: Negative (05/31 0000)  HIV: Non-reactive (05/31 0000)  GBS: Positive/-- (11/20 0000)  1 hr Glucola : 86 Genetic Screening: low risk Panorama XX Ultrasound: normal XX anatomy with exception of RRP, anterior placenta, last growth 68% at [redacted]w[redacted]d    Maternal Diabetes: No Genetic Screening: Normal Maternal Ultrasounds/Referrals: Fetal Kidney Anomalies Fetal Ultrasounds or other Referrals:  Referred to Materal Fetal Medicine  Maternal Substance Abuse:  No Significant Maternal Medications:  None Significant Maternal Lab Results:  Group B Strep positive Other Comments:  None  Medical / Surgical History : Past medical history:  Past Medical History:  Diagnosis Date   Anxiety    Hirsutism      Past surgical history:  Past Surgical History:  Procedure Laterality Date   CESAREAN SECTION N/A 01/18/2019   Procedure: CESAREAN SECTION;  Surgeon: Shea Evans, MD;  Location: [facility] LD ORS;  Service: Obstetrics;  Laterality: N/A;    Family History:  Family History  Problem Relation Age of Onset   Diabetes Neg Hx    Hypertension Neg Hx     Social History:  reports that she has never smoked. She has never used smokeless tobacco. She reports that she does not drink alcohol and does not use drugs.  Allergies: Pineapple and Shrimp [shellfish allergy]   Current Medications at time of admission:  Medications Prior to Admission  Medication Sig Dispense Refill Last Dose/Taking   Cholecalciferol (VITAMIN D-3) 125 MCG (5000 UT) TABS Take 1 tablet by mouth daily.   Taking   Prenatal Vit-Fe Fumarate-FA (PRENATAL MULTIVITAMIN) TABS tablet Take 1 tablet by mouth daily at 12 noon.   02/08/2023   cetirizine (ZYRTEC ALLERGY) 10 MG tablet Take 1 tablet (10 mg total) by mouth daily. (Patient not taking: Reported on 12/13/2022) 30 tablet 11    famotidine (PEPCID) 20 MG tablet Take 20 mg by mouth 2 (two) times daily.       Review of Systems: Review of Systems  All other systems reviewed and are negative.  Physical Exam: Vital signs and nursing notes reviewed.  Patient Vitals for the past 24 hrs:  BP Temp Pulse Resp Height Weight  02/08/23 1225 103/64 97.6 F (36.4 C) (!) 108 18 5\' 5"  (1.651 m) 77.1 kg    General: AAO x 3, NAD Heart:  RRR Lungs:CTAB Abdomen: Gravid, NT Extremities: no edema SVE:   6/60/-2, BBOW in office this AM  FHR: 140BPM, moderate variability, + accels, no decels TOCO: Contractions irregular  Labs:   No results for input(s): "WBC", "HGB", "HCT", "PLT" in the last 72 hours.  Assessment/Plan: 27 y.o. G2P1001 at [redacted]w[redacted]d, TOLAC, latent labor GBS positive RRP, 12.17mm at last U/S  Fetal wellbeing - FHT category 1, continuous EFM for TOLAC per ACOG guidelines EFW AGA  6-7lbs  Labor: Plan PCN x 2 doses then plan AROM, Pitocin prn  GBS positive, plan PCN Rubella immune Rh positive  Pain control: desires unmedicated birth Analgesia/anesthesia PRN  Anticipated MOD: VBAC  Plans to breastfeed. POC discussed with patient and support team, all questions answered.  Dr. Amado Nash notified of admission/plan of care.  June Leap CNM, MSN 02/08/2023, 1:49 PM

## 2023-02-08 NOTE — Anesthesia Procedure Notes (Signed)
Epidural Patient location during procedure: OB Start time: 02/08/2023 8:55 PM End time: 02/08/2023 9:04 PM  Staffing Anesthesiologist: Mal Amabile, MD Performed: anesthesiologist   Preanesthetic Checklist Completed: patient identified, IV checked, site marked, risks and benefits discussed, surgical consent, monitors and equipment checked, pre-op evaluation and timeout performed  Epidural Patient position: sitting Prep: DuraPrep and site prepped and draped Patient monitoring: continuous pulse ox and blood pressure Approach: midline Location: L3-L4 Injection technique: LOR air  Needle:  Needle type: Tuohy  Needle gauge: 17 G Needle length: 9 cm and 9 Needle insertion depth: 6 cm Catheter type: closed end flexible Catheter size: 19 Gauge Catheter at skin depth: 11 cm Test dose: negative and Other  Assessment Events: blood not aspirated, no cerebrospinal fluid, injection not painful, no injection resistance, no paresthesia and negative IV test  Additional Notes Patient identified. Risks and benefits discussed including failed block, incomplete  Pain control, post dural puncture headache, nerve damage, paralysis, blood pressure Changes, nausea, vomiting, reactions to medications-both toxic and allergic and post Partum back pain. All questions were answered. Patient expressed understanding and wished to proceed. Sterile technique was used throughout procedure. Epidural site was Dressed with sterile barrier dressing. No paresthesias, signs of intravascular injection Or signs of intrathecal spread were encountered.  Patient was more comfortable after the epidural was dosed. Please see RN's note for documentation of vital signs and FHR which are stable. Reason for block:procedure for pain

## 2023-02-08 NOTE — Progress Notes (Signed)
S: Feeling mild contractions. Discussed the R/B/A of AROM for labor induction and patient consents to procedure. Husband, Rande Brunt, present and supportive.   O: Vitals:   02/08/23 1225 02/08/23 1552  BP: 103/64 107/83  Pulse: (!) 108 74  Resp: 18 17  Temp: 97.6 F (36.4 C) 97.9 F (36.6 C)  TempSrc:  Oral  Weight: 77.1 kg   Height: 5\' 5"  (1.651 m)    FHT:  FHR: 145 bpm, variability: moderate,  accelerations:  Present,  decelerations:  Absent UC:   irregular, every 1.5-3 minutes SVE:   Dilation: 6 Effacement (%): 70 Station: -1 Exam by:: Dorisann Frames, CNM  AROM of a small amount of clear fluid at 1836.   A / P: Induction of labor due to TOLAC, s/p Foley balloon yesterday in office  Fetal Wellbeing:  Category I GBS: Positive, 2nd dose of PCN infusing now Pain Control:  Labor support without medications Anticipated MOD:   VBAC  Plan expectant management for 4 hours. Stat Pitocin if no active labor in 4 hours.   June Leap, CNM, MSN 02/08/2023, 6:41 PM

## 2023-02-08 NOTE — Anesthesia Preprocedure Evaluation (Signed)
Anesthesia Evaluation  Patient identified by MRN, date of birth, ID band Patient awake    Reviewed: Allergy & Precautions, NPO status , Patient's Chart, lab work & pertinent test results  Airway Mallampati: II  TM Distance: >3 FB Neck ROM: Full    Dental no notable dental hx. (+) Teeth Intact   Pulmonary neg pulmonary ROS   Pulmonary exam normal breath sounds clear to auscultation       Cardiovascular negative cardio ROS Normal cardiovascular exam Rhythm:Regular Rate:Normal     Neuro/Psych   Anxiety     negative neurological ROS     GI/Hepatic Neg liver ROS,GERD  Medicated,,  Endo/Other  negative endocrine ROS    Renal/GU negative Renal ROS  negative genitourinary   Musculoskeletal negative musculoskeletal ROS (+)    Abdominal   Peds  Hematology negative hematology ROS (+)   Anesthesia Other Findings   Reproductive/Obstetrics (+) Pregnancy Previous C/Section for malpresentation                               Anesthesia Physical Anesthesia Plan  ASA: 2  Anesthesia Plan: Epidural   Post-op Pain Management:    Induction:   PONV Risk Score and Plan: 4 or greater and Treatment may vary due to age or medical condition  Airway Management Planned: Natural Airway  Additional Equipment:   Intra-op Plan:   Post-operative Plan:   Informed Consent: I have reviewed the patients History and Physical, chart, labs and discussed the procedure including the risks, benefits and alternatives for the proposed anesthesia with the patient or authorized representative who has indicated his/her understanding and acceptance.       Plan Discussed with: CRNA  Anesthesia Plan Comments:          Anesthesia Quick Evaluation

## 2023-02-08 NOTE — MAU Note (Signed)
.  Kristine Singleton is a 27 y.o. at [redacted]w[redacted]d here in MAU reporting: sent from office for dilation 6cm after foley balloon fell out.L&D unable to take her as a direct admit.  Pt is feeling cts but working with a Dula and managing well. Denies any vag bleeding  or leaking. Good fetal movement felt.+GBS here to get antibiotics.  LMP:  Onset of complaint: today Pain score: 3 Vitals:   02/08/23 1225  BP: 103/64  Pulse: (!) 108  Resp: 18  Temp: 97.6 F (36.4 C)     FHT:148 Lab orders placed from triage:

## 2023-02-08 NOTE — Progress Notes (Signed)
S: Comfortable with epidural. Husband, Fazel, present and supportive.   O: Vitals:   02/08/23 2107 02/08/23 2111 02/08/23 2112 02/08/23 2116  BP: (!) 99/53  104/71 111/75  Pulse: (!) 112  98 85  Resp: 18     Temp:      TempSrc:      SpO2:  100%  100%  Weight:      Height:       FHT:  FHR: 145 bpm, variability: moderate,  accelerations:  Present,  decelerations:  Absent UC:   regular, every 1.5-2.5 minutes SVE:   Dilation: 6.5 Effacement (%): 90 Station: -1 Exam by:: Kristine Singleton CNM  A / P: Augmentation of labor, progressing well  Fetal Wellbeing:  Category I GBS: Positive, PCN on going Pain Control:  Epidural Anticipated MOD:   VBAC  Contracting every 1.5-2.5 minutes. Recheck SVE in 2-3 hours. If no progress, will place IUPC and plan Pitocin augmentation.   Dr. Amado Nash updated with patient status and plan of care.   Kristine Singleton, CNM, MSN 02/08/2023, 9:36 PM

## 2023-02-09 ENCOUNTER — Encounter (HOSPITAL_COMMUNITY): Payer: Self-pay | Admitting: Obstetrics and Gynecology

## 2023-02-09 ENCOUNTER — Other Ambulatory Visit: Payer: Self-pay

## 2023-02-09 DIAGNOSIS — O34219 Maternal care for unspecified type scar from previous cesarean delivery: Secondary | ICD-10-CM | POA: Diagnosis not present

## 2023-02-09 DIAGNOSIS — O411290 Chorioamnionitis, unspecified trimester, not applicable or unspecified: Secondary | ICD-10-CM | POA: Diagnosis not present

## 2023-02-09 LAB — RPR: RPR Ser Ql: NONREACTIVE

## 2023-02-09 MED ORDER — ZOLPIDEM TARTRATE 5 MG PO TABS: 5.0000 mg | ORAL_TABLET | Freq: Every evening | ORAL | Status: DC | PRN

## 2023-02-09 MED ORDER — COCONUT OIL OIL: 1.0000 | TOPICAL_OIL | Status: DC | PRN

## 2023-02-09 MED ORDER — PRENATAL MULTIVITAMIN CH
1.0000 | ORAL_TABLET | Freq: Every day | ORAL | Status: DC
Start: 2023-02-09 — End: 2023-02-10
  Administered 2023-02-09 – 2023-02-10 (×2): 1 via ORAL
  Filled 2023-02-09 (×2): qty 1

## 2023-02-09 MED ORDER — ACETAMINOPHEN 500 MG PO TABS
1000.0000 mg | ORAL_TABLET | Freq: Four times a day (QID) | ORAL | Status: DC | PRN
  Administered 2023-02-09: 1000 mg via ORAL
  Filled 2023-02-09: qty 2

## 2023-02-09 MED ORDER — TERBUTALINE SULFATE 1 MG/ML IJ SOLN: 0.2500 mg | Freq: Once | INTRAMUSCULAR | Status: DC | PRN

## 2023-02-09 MED ORDER — IBUPROFEN 600 MG PO TABS
600.0000 mg | ORAL_TABLET | Freq: Four times a day (QID) | ORAL | Status: DC
Start: 2023-02-09 — End: 2023-02-10
  Administered 2023-02-09 – 2023-02-10 (×5): 600 mg via ORAL
  Filled 2023-02-09 (×5): qty 1

## 2023-02-09 MED ORDER — DIPHENHYDRAMINE HCL 25 MG PO CAPS: 25.0000 mg | ORAL_CAPSULE | Freq: Four times a day (QID) | ORAL | Status: DC | PRN

## 2023-02-09 MED ORDER — SIMETHICONE 80 MG PO CHEW
80.0000 mg | CHEWABLE_TABLET | ORAL | Status: DC | PRN
  Administered 2023-02-09 – 2023-02-10 (×3): 80 mg via ORAL
  Filled 2023-02-09 (×3): qty 1

## 2023-02-09 MED ORDER — ONDANSETRON HCL 4 MG PO TABS
4.0000 mg | ORAL_TABLET | ORAL | Status: DC | PRN
Start: 2023-02-09 — End: 2023-02-10

## 2023-02-09 MED ORDER — GENTAMICIN SULFATE 40 MG/ML IJ SOLN
5.0000 mg/kg | INTRAVENOUS | Status: DC
  Administered 2023-02-09: 330 mg via INTRAVENOUS
  Filled 2023-02-09: qty 8.25

## 2023-02-09 MED ORDER — ACETAMINOPHEN 325 MG PO TABS
650.0000 mg | ORAL_TABLET | ORAL | Status: DC | PRN
  Administered 2023-02-09 – 2023-02-10 (×3): 650 mg via ORAL
  Filled 2023-02-09 (×3): qty 2

## 2023-02-09 MED ORDER — DIBUCAINE (PERIANAL) 1 % EX OINT: 1.0000 | TOPICAL_OINTMENT | CUTANEOUS | Status: DC | PRN

## 2023-02-09 MED ORDER — BENZOCAINE-MENTHOL 20-0.5 % EX AERO
1.0000 | INHALATION_SPRAY | CUTANEOUS | Status: DC | PRN
  Administered 2023-02-09: 1 via TOPICAL
  Filled 2023-02-09 (×2): qty 56

## 2023-02-09 MED ORDER — WITCH HAZEL-GLYCERIN EX PADS
1.0000 | MEDICATED_PAD | CUTANEOUS | Status: DC | PRN
  Administered 2023-02-09: 1 via TOPICAL

## 2023-02-09 MED ORDER — SENNOSIDES-DOCUSATE SODIUM 8.6-50 MG PO TABS
2.0000 | ORAL_TABLET | Freq: Every day | ORAL | Status: DC
  Administered 2023-02-10: 2 via ORAL
  Filled 2023-02-09: qty 2

## 2023-02-09 MED ORDER — ONDANSETRON HCL 4 MG/2ML IJ SOLN: 4.0000 mg | INTRAMUSCULAR | Status: DC | PRN

## 2023-02-09 MED ORDER — OXYTOCIN-SODIUM CHLORIDE 30-0.9 UT/500ML-% IV SOLN
1.0000 m[IU]/min | INTRAVENOUS | Status: DC
  Administered 2023-02-09: 2 m[IU]/min via INTRAVENOUS

## 2023-02-09 MED ORDER — TETANUS-DIPHTH-ACELL PERTUSSIS 5-2.5-18.5 LF-MCG/0.5 IM SUSY: 0.5000 mL | PREFILLED_SYRINGE | Freq: Once | INTRAMUSCULAR | Status: DC

## 2023-02-09 MED ORDER — SODIUM CHLORIDE 0.9 % IV SOLN
2.0000 g | Freq: Four times a day (QID) | INTRAVENOUS | Status: AC
  Administered 2023-02-09 (×4): 2 g via INTRAVENOUS
  Filled 2023-02-09 (×4): qty 2000

## 2023-02-09 NOTE — Lactation Note (Signed)
This note was copied from a baby's chart. Lactation Consultation Note  Patient Name: Kristine Singleton [id number] Date: 02/09/2023 Age:28 hours  Experienced breast feeding mother, declines lactation.   Consult Status Consult Status: Complete    Idamae Lusher 02/09/2023, 2:17 PM

## 2023-02-09 NOTE — Progress Notes (Signed)
ANTIBIOTIC CONSULT NOTE - INITIAL  Pharmacy Consult for Gentamicin Indication: Chorioamnionitis   Allergies  Allergen Reactions   Pineapple Itching    Throat itches   Shrimp [Shellfish Allergy] Rash    Patient Measurements: Height: 5\' 5"  (165.1 cm) Weight: 77.1 kg (170 lb) IBW/kg (Calculated) : 57 Adjusted Body Weight: 65 kg  Vital Signs: Temp: 101 F (38.3 C) (12/13 0250) Temp Source: Oral (12/13 0250) BP: 92/70 (12/13 0302) Pulse Rate: 157 (12/13 0302)  Labs: Recent Labs    02/08/23 1335  WBC 12.6*  HGB 12.9  PLT 218   No results for input(s): "GENTTROUGH", "GENTPEAK", "GENTRANDOM" in the last 72 hours.   Microbiology: Recent Results (from the past 720 hours)  Wet prep, genital     Status: None   Collection Time: 01/14/23  3:05 AM   Specimen: Vaginal  Result Value Ref Range Status   Yeast Wet Prep HPF POC NONE SEEN NONE SEEN Final   Trich, Wet Prep NONE SEEN NONE SEEN Final   Clue Cells Wet Prep HPF POC NONE SEEN NONE SEEN Final   WBC, Wet Prep HPF POC <10 <10 Final   Sperm NONE SEEN  Final    Comment: Performed at [facility], [address] [place], [place] [address]  OB RESULT CONSOLE Group B Strep     Status: None   Collection Time: 01/17/23 12:00 AM  Result Value Ref Range Status   GBS Positive  Final    Medications:  Ampicillin 2g q6 hours  Assessment: 27 y.o. female G2P1001 at [redacted]w[redacted]d presented in labor.  After AROM, maternal temp 101 with presumed chorioamnionitis.  Started ampicillin and gentamicin.    Plan:   Gentamicin 5 mg/kg mg IV every 24 hrs  Check Scr with next labs if gentamicin continued. Will check gentamicin levels if continued > 72hr or clinically indicated.  Kristine Singleton 02/09/2023,3:48 AM

## 2023-02-09 NOTE — Lactation Note (Signed)
This note was copied from a baby's chart. Lactation Consultation Note  Patient Name: Girl Dominika Kostas [id number] Date: 02/09/2023 Age:28 hours Reason for consult: Initial assessment;Term Mom called out asking for Lactation d/t baby not interested in BF and had been BF great this morning. Before LC came to room baby had large emesis. Mom understands now why baby isn't wanting to feed. Mom stated she had been BF great and no pain or difficulty latching but this evening the baby had been very sleepy and not wanting to BF and mom became worried.  Hand expression demonstrated and collected some colostrum. Baby not even interested in taking colostrum from spoon. Encouraged mom to hold baby STS and every 3 hrs maybe hand express some colostrum to remind baby that she might be hungry. Encouraged mom not to worry at this time, the baby will get hungry once she clear her tummy out. Usually takes about 24 hrs then they start cluster feeding. Experienced BF mom remembers those days w/her first daughter. Mom was appreciative for consult. Will call if has further questions or concerns.  Maternal Data Has patient been taught Hand Expression?: Yes Does the patient have breastfeeding experience prior to this delivery?: Yes How long did the patient breastfeed?: 2 yrs and 3 months to her now 27 yr old  Feeding    LATCH Score Latch: Too sleepy or reluctant, no latch achieved, no sucking elicited.  Audible Swallowing: None  Type of Nipple: Everted at rest and after stimulation  Comfort (Breast/Nipple): Soft / non-tender  Hold (Positioning): No assistance needed to correctly position infant at breast.  LATCH Score: 6   Lactation Tools Discussed/Used    Interventions Interventions: Breast feeding basics reviewed;Skin to skin;Breast massage;Hand express;LC Services brochure  Discharge    Consult Status Consult Status: Complete    Taseen Marasigan G 02/09/2023, 8:59 PM

## 2023-02-09 NOTE — Progress Notes (Addendum)
S: Comfortable with epidural.   O: Vitals:   02/09/23 0135 02/09/23 0201 02/09/23 0231 02/09/23 0250  BP: (!) 91/58 99/62 95/61    Pulse: (!) 102 100 (!) 105   Resp: 16 15    Temp: 98.9 F (37.2 C)   (!) 101 F (38.3 C)  TempSrc: Oral   Oral  SpO2:      Weight:      Height:       FHT:  FHR: 170 bpm, variability: moderate,  accelerations:  Absent,  decelerations:  Absent UC:   regular, every 1.5-3 minutes SVE:   Dilation: Lip/rim Effacement (%): 100 Station: -1, 0 Exam by:: Dorisann Frames CNM  A / P: Augmentation of labor, progressing well  Fetal Wellbeing:  Category II GBS: Positive, PCN on going Pain Control:  Epidural Anticipated MOD:   VBAC  Good progress after AROM, now a rim. Maternal temp 101.22F. Likely chorioamnionitis. Will give 1g Tylenol now. PCN ongoing for GBS. Will switch to ampicillin and gentamicin now and continue for 24 hours. Allow 1-2 hours for laboring down and start pushing.   Dr, Amado Nash updated on patient status and consulted for plan of care.   June Leap, CNM, MSN 02/09/2023, 2:53 AM

## 2023-02-10 LAB — CBC
HCT: 30.7 % — ABNORMAL LOW (ref 36.0–46.0)
Hemoglobin: 10.4 g/dL — ABNORMAL LOW (ref 12.0–15.0)
MCH: 30 pg (ref 26.0–34.0)
MCHC: 33.9 g/dL (ref 30.0–36.0)
MCV: 88.5 fL (ref 80.0–100.0)
Platelets: 179 10*3/uL (ref 150–400)
RBC: 3.47 MIL/uL — ABNORMAL LOW (ref 3.87–5.11)
RDW: 14.1 % (ref 11.5–15.5)
WBC: 16.3 10*3/uL — ABNORMAL HIGH (ref 4.0–10.5)
nRBC: 0 % (ref 0.0–0.2)

## 2023-02-10 MED ORDER — IBUPROFEN 600 MG PO TABS: 600.0000 mg | ORAL_TABLET | Freq: Four times a day (QID) | ORAL | Status: AC

## 2023-02-10 MED ORDER — ACETAMINOPHEN 325 MG PO TABS: 650.0000 mg | ORAL_TABLET | ORAL | Status: AC | PRN

## 2023-02-10 NOTE — Progress Notes (Signed)
Postpartum Progress Note  PPD#1 s/p SVD  S: Patient seen and examined at bedside. Reports feeling overall well. Pain well controlled, ambulating, tolerating regular diet, voiding without issue. Denies fever, chills, chest pain, shortness of breath.  Feeding: plans to breastfeed Circ: N/A - female neonate  O:  Vitals:   02/09/23 2000 02/10/23 0443  BP: 102/75 101/66  Pulse: 71 68  Resp: 18 18  Temp: 98.3 F (36.8 C)   SpO2: 100% 100%    PE:  GA: well appearing, NAD CV: RRR, normal S1, S2 Lungs: CTAB Peri: moderate lochia Ext: no TTP, +1 non-pitting edema  Labs:  Lab Results  Component Value Date   WBC 16.3 (H) 02/10/2023   HGB 10.4 (L) 02/10/2023   HCT 30.7 (L) 02/10/2023   MCV 88.5 02/10/2023   PLT 179 02/10/2023   No results found for: "CREATININE"  A/P:  27 y.o.yo [id number] PPD#1 s/p SVD (EBL 246 mL) with chorioamnionitis now s/p 24h antibiotics postpartum, doing well and progressing appropriately. Vitals within normal limits. Physical exam benign. Labs normal. Plan as follows:   #Routine OB - Regular diet, HLIV - ERAS for pain control  - Rh positive - DVT ppx: ambulating - BCM: Unsure  #Neonate -plans to breastfeed   #Chorioamnionitis - S/p 24h of antibiotics postpartum  - Afebrile >24h with normal vitals   Anticipate discharge home today.   Marlene Bast, MD

## 2023-02-10 NOTE — Discharge Instructions (Signed)
Congratulations! We hope you have a wonderful postpartum period. We will plan to see you in the office in 6 weeks. Please call the office if you experience fevers (>100.4 F), heavy vaginal bleeding (using >2 pads/hour), severe pain not responsive to ibuprofen (Advil or Motrin) and acetaminophen (Tylenol), chest pain, shortness of breath, or if you have pain, redness, or swelling in one or both legs. Mood swings, fatigue, and feeling down can be normal in the first two weeks following birth. If you feel your mood symptoms are severe or lasting longer than two weeks, please call our office. If you have any thoughts of hurting yourself or others please call our office. Please call your pediatrician if you have any concerns about your baby.

## 2023-02-10 NOTE — Anesthesia Postprocedure Evaluation (Signed)
Anesthesia Post Note  Patient: Kristine Singleton  Procedure(s) Performed: AN AD HOC LABOR EPIDURAL     Patient location during evaluation: Mother Baby Anesthesia Type: Epidural Level of consciousness: awake and alert Pain management: pain level controlled Vital Signs Assessment: post-procedure vital signs reviewed and stable Respiratory status: spontaneous breathing, nonlabored ventilation and respiratory function stable Cardiovascular status: stable Postop Assessment: no headache, no backache and epidural receding Anesthetic complications: no   No notable events documented.  Last Vitals:  Vitals:   02/09/23 2000 02/10/23 0443  BP: 102/75 101/66  Pulse: 71 68  Resp: 18 18  Temp: 36.8 C   SpO2: 100% 100%    Last Pain:  Vitals:   02/10/23 0726  TempSrc:   PainSc: 0-No pain   Pain Goal:                   [facility]ica Records

## 2023-02-10 NOTE — Discharge Summary (Signed)
Postpartum Discharge Summary  Patient Name: Kristine Singleton DOB: [birth year] MRN: [id number]  Date of admission: 02/08/2023 Delivery date:02/09/2023 Delivering provider: Dorisann Frames K Date of discharge: 02/10/2023  Admitting diagnosis: Normal labor [O80, Z37.9] Intrauterine pregnancy: [redacted]w[redacted]d     Secondary diagnosis:  Principal Problem:   Postpartum care following vaginal delivery 12/13 Active Problems:   Previous cesarean delivery - desires TOLAC   Fetal pyelectasis in singleton pregnancy   Normal labor   Positive GBS test   VBAC (vaginal birth after Cesarean)   Chorioamnionitis   Second degree perineal laceration  Additional problems: None    Discharge diagnosis: Term Pregnancy Delivered                                              Post partum procedures: N/A Augmentation: AROM and Pitocin Complications: Intrauterine Inflammation or infection (Chorioamniotis)  Hospital course: Onset of Labor With Vaginal Delivery      27 y.o. yo [id number] at [redacted]w[redacted]d was admitted in Active Labor on 02/08/2023. Labor course was complicated by chorioamnionitis.   Membrane Rupture Time/Date: 6:36 PM,02/08/2023  Delivery Method:VBAC, Spontaneous Operative Delivery:N/A Episiotomy: None Lacerations:  2nd degree Patient had an uncomplicated postpartum course.  She is ambulating, tolerating a regular diet, passing flatus, and urinating well. Patient is discharged home in stable condition on 02/10/23.  Newborn Data: Birth date:02/09/2023 Birth time:6:55 AM Gender:Female Living status:Living Apgars:9 ,9  Weight:3550 g  Magnesium Sulfate received: No BMZ received: No Rhophylac:N/A MMR:N/A Transfusion:No Immunizations administered: There is no immunization history for the selected administration types on file for this patient.  Physical exam  Vitals:   02/09/23 1528 02/09/23 2000 02/10/23 0443 02/10/23 0915  BP: 102/66 102/75 101/66 93/62  Pulse: 67 71 68 78  Resp: 17 18 18 16   Temp: 98.4  F (36.9 C) 98.3 F (36.8 C)  97.8 F (36.6 C)  TempSrc: Oral Oral  Oral  SpO2: 99% 100% 100%   Weight:      Height:       General: alert, cooperative, and no distress Lochia: appropriate Uterine Fundus: firm Incision: N/A DVT Evaluation: No evidence of DVT seen on physical exam. Labs: Lab Results  Component Value Date   WBC 16.3 (H) 02/10/2023   HGB 10.4 (L) 02/10/2023   HCT 30.7 (L) 02/10/2023   MCV 88.5 02/10/2023   PLT 179 02/10/2023       No data to display         [place] Score:    02/10/2023    9:15 AM  Edinburgh Postnatal Depression Scale Screening Tool  I have been able to laugh and see the funny side of things. 0  I have looked forward with enjoyment to things. 0  I have blamed myself unnecessarily when things went wrong. 1  I have been anxious or worried for no good reason. 1  I have felt scared or panicky for no good reason. 1  Things have been getting on top of me. 0  I have been so unhappy that I have had difficulty sleeping. 0  I have felt sad or miserable. 0  I have been so unhappy that I have been crying. 0  The thought of harming myself has occurred to me. 0  Edinburgh Postnatal Depression Scale Total 3      After visit meds:  Allergies as of 02/10/2023  Reactions   Pineapple Itching   Throat itches   Shrimp [shellfish Allergy] Rash        Medication List     TAKE these medications    acetaminophen 325 MG tablet Commonly known as: Tylenol Take 2 tablets (650 mg total) by mouth every 4 (four) hours as needed (for pain scale < 4).   cetirizine 10 MG tablet Commonly known as: ZyrTEC Allergy Take 1 tablet (10 mg total) by mouth daily.   famotidine 20 MG tablet Commonly known as: PEPCID Take 20 mg by mouth 2 (two) times daily.   ibuprofen 600 MG tablet Commonly known as: ADVIL Take 1 tablet (600 mg total) by mouth every 6 (six) hours.   prenatal multivitamin Tabs tablet Take 1 tablet by mouth daily at 12 noon.    Vitamin D-3 125 MCG (5000 UT) Tabs Take 1 tablet by mouth daily.         Discharge home in stable condition Infant Feeding: Breast Infant Disposition:home with mother Discharge instruction: per After Visit Summary and Postpartum booklet. Activity: Advance as tolerated. Pelvic rest for 6 weeks.  Diet: routine diet Anticipated Birth Control: Unsure Postpartum Appointment:6 weeks Additional Postpartum F/U:  N/A Future Appointments:No future appointments. Follow up Visit:  Follow-up Information     Ob[facility], [facility] Follow up in 6 week(s).   Contact information: [address] [place] [address] [phone]                     02/10/2023 Edger House, MD

## 2023-02-17 ENCOUNTER — Telehealth (HOSPITAL_COMMUNITY): Payer: Self-pay

## 2023-02-17 NOTE — Telephone Encounter (Signed)
02/17/2023 1142  Name: ARBADELLA WAAG MRN: [id number] DOB: 1995/06/25  Reason for Call:  Transition of Care Hospital Discharge Call  Contact Status: Patient Contact Status: Complete  Language assistant needed:          Follow-Up Questions: Do You Have Any Concerns About Your Health As You Heal From Delivery?: No Do You Have Any Concerns About Your Infants Health?: No  Edinburgh Postnatal Depression Scale:  In the Past 7 Days:    PHQ2-9 Depression Scale:     Discharge Follow-up: Edinburgh score requires follow up?:  (Patient states that she did EPDS recently with her Doula and plans to continue screening every 2 weeks with her. She states that her EPDS score was a 3 recently with her Doula. Patient declines EPDS today. Patient states she is doing well.) Patient was advised of the following resources:: Support Group, Breastfeeding Support Group  Post-discharge interventions: Reviewed Newborn Safe Sleep Practices  Signature  Signe Colt

## 2024-03-19 ENCOUNTER — Encounter: Payer: Self-pay | Admitting: Emergency Medicine

## 2024-03-19 ENCOUNTER — Ambulatory Visit: Admission: EM | Admit: 2024-03-19 | Discharge: 2024-03-19 | Disposition: A | Discharge status: Home / Self Care

## 2024-03-19 DIAGNOSIS — R197 Diarrhea, unspecified: Secondary | ICD-10-CM

## 2024-03-19 DIAGNOSIS — R110 Nausea: Secondary | ICD-10-CM

## 2024-03-19 MED ORDER — ONDANSETRON 4 MG PO TBDP: 4.0000 mg | ORAL_TABLET | Freq: Three times a day (TID) | ORAL | 0 refills | Status: AC | PRN

## 2024-03-19 MED ORDER — SODIUM CHLORIDE 0.9 % IV SOLN: Freq: Once | INTRAVENOUS | Status: AC

## 2024-03-19 MED ORDER — ONDANSETRON 4 MG PO TBDP
4.0000 mg | ORAL_TABLET | Freq: Once | ORAL | Status: AC
  Administered 2024-03-19: 4 mg via ORAL

## 2024-03-19 NOTE — ED Triage Notes (Addendum)
 Pt c/o diarrhea, loss of appetite and nausea for about 10 days. States she has been going around 20 times a day and is feeling very weak and dizzy.   She did have fever and chills about 4 days ago.  States she started menstrual cycle last night and it is very heavy.  She did take imodium last night which hasn't helped yet

## 2024-03-19 NOTE — ED Provider Notes (Signed)
 " [facility]    CSN: [id number] Arrival date & time: 03/19/24  1225      History   Chief Complaint Chief Complaint  Patient presents with   Diarrhea    HPI Kristine Singleton is a 29 y.o. female presenting with nausea and diarrhea.   Pt c/o diarrhea, loss of appetite and nausea for about 10 days. Also with chills. States she has been having about 20 episodes of watery diarrhea daily.  She is now feeling weak and lightheaded with standing.  There is scant bright red blood on the toilet paper with wiping, but she attributes this to a hemorrhoid.  She is using Preparation H to manage this.  Describes generalized crampy and sharp abdominal pain.  Endorses nausea, without vomiting.  She is able to eat and drink. Denies recent travel in last 2 weeks, no abx in last month. Started zoloft 2 months ago. Sister with similar symptoms in the last month.  States she started menstrual cycle last night and it is very heavy.   She did take imodium last night which hasn't helped yet  H/o c-section. No other prior history of abdominal procedure or surgery.    HPI  Past Medical History:  Diagnosis Date   Anxiety    Hirsutism     Patient Active Problem List   Diagnosis Date Noted   VBAC (vaginal birth after Cesarean) 02/09/2023   Chorioamnionitis 02/09/2023   Second degree perineal laceration 02/09/2023   Postpartum care following vaginal delivery 12/13 02/09/2023   Normal labor 02/08/2023   Positive GBS test 02/08/2023   Previous cesarean delivery - desires TOLAC 12/11/2022   Fetal pyelectasis in singleton pregnancy 12/11/2022    Past Surgical History:  Procedure Laterality Date   CESAREAN SECTION N/A 01/18/2019   Procedure: CESAREAN SECTION;  Surgeon: Barbette Knock, MD;  Location: [facility] OR[facility];  Service: Obstetrics;  Laterality: N/A;    OB History     Gravida  2   Para  2   Term  2   Preterm      AB      Living  2      SAB      IAB      Ectopic       Multiple  0   Live Births  2            Home Medications    Prior to Admission medications  Medication Sig Start Date End Date Taking? Authorizing Provider  ondansetron  (ZOFRAN -ODT) 4 MG disintegrating tablet Take 1 tablet (4 mg total) by mouth every 8 (eight) hours as needed for nausea or vomiting. 03/19/24  Yes Dakayla Disanti E, PA-C  acetaminophen  (TYLENOL ) 325 MG tablet Take 2 tablets (650 mg total) by mouth every 4 (four) hours as needed (for pain scale < 4). 02/10/23   D'Iorio, Hadassah LABOR, MD  cetirizine  (ZYRTEC  ALLERGY) 10 MG tablet Take 1 tablet (10 mg total) by mouth daily. Patient not taking: Reported on 12/13/2022 05/05/21   Mayers, Cari S, PA-C  Cholecalciferol (VITAMIN D-3) 125 MCG (5000 UT) TABS Take 1 tablet by mouth daily.    [provider]  famotidine (PEPCID) 20 MG tablet Take 20 mg by mouth 2 (two) times daily.    [provider]  ibuprofen  (ADVIL ) 600 MG tablet Take 1 tablet (600 mg total) by mouth every 6 (six) hours. 02/10/23   D'Iorio, Hadassah LABOR, MD  Prenatal Vit-Fe Fumarate-FA (PRENATAL MULTIVITAMIN) TABS tablet Take 1 tablet  by mouth daily at 12 noon.    [provider]  sertraline (ZOLOFT) 25 MG tablet Take 25 mg by mouth daily.    [provider]    Family History Family History  Problem Relation Age of Onset   Diabetes Neg Hx    Hypertension Neg Hx     Social History Social History[1]   Allergies   Pineapple and Shrimp [shellfish allergy]   Review of Systems Review of Systems  Constitutional:  Negative for appetite change, chills, diaphoresis, fever and unexpected weight change.  HENT:  Negative for congestion, ear pain, sinus pressure, sinus pain, sneezing, sore throat and trouble swallowing.   Respiratory:  Negative for cough, chest tightness and shortness of breath.   Cardiovascular:  Negative for chest pain.  Gastrointestinal:  Positive for abdominal pain, diarrhea and nausea. Negative for abdominal distention,  anal bleeding, blood in stool, constipation, rectal pain and vomiting.  Genitourinary:  Negative for dysuria, flank pain, frequency and urgency.  Musculoskeletal:  Negative for back pain and myalgias.  Neurological:  Negative for dizziness, light-headedness and headaches.     Physical Exam Triage Vital Signs ED Triage Vitals  Encounter Vitals Group     BP      Girls Systolic BP Percentile      Girls Diastolic BP Percentile      Boys Systolic BP Percentile      Boys Diastolic BP Percentile      Pulse      Resp      Temp      Temp src      SpO2      Weight      Height      Head Circumference      Peak Flow      Pain Score      Pain Loc      Pain Education      Exclude from Growth Chart    No data found.  Updated Vital Signs BP 117/83 (BP Location: Right Arm)   Pulse 81   Temp 98.2 F (36.8 C) (Oral)   Resp 17   LMP 03/18/2024 (Exact Date)   SpO2 96%   Breastfeeding Yes   Visual Acuity Right Eye Distance:   Left Eye Distance:   Bilateral Distance:    Right Eye Near:   Left Eye Near:    Bilateral Near:     Physical Exam Vitals reviewed.  Constitutional:      General: She is not in acute distress.    Appearance: Normal appearance. She is not ill-appearing.  HENT:     Head: Normocephalic and atraumatic.     Mouth/Throat:     Mouth: Mucous membranes are moist.     Comments: Moist mucous membranes Eyes:     Extraocular Movements: Extraocular movements intact.     Pupils: Pupils are equal, round, and reactive to light.  Cardiovascular:     Rate and Rhythm: Normal rate and regular rhythm.     Heart sounds: Normal heart sounds.  Pulmonary:     Effort: Pulmonary effort is normal.     Breath sounds: Normal breath sounds. No wheezing, rhonchi or rales.  Abdominal:     General: Bowel sounds are increased. There is no distension.     Palpations: Abdomen is soft. There is no mass.     Tenderness: There is generalized abdominal tenderness. There is no right CVA  tenderness, left CVA tenderness, guarding or rebound. Negative signs include Murphy's sign, Rovsing's  sign and McBurney's sign.     Comments: There is generalized abdominal tenderness to palpation, without guarding or rebound.  She is comfortable throughout exam  Skin:    General: Skin is warm.     Capillary Refill: Capillary refill takes 2 to 3 seconds.     Comments: Good skin turgor  Neurological:     General: No focal deficit present.     Mental Status: She is alert and oriented to person, place, and time.  Psychiatric:        Mood and Affect: Mood normal.        Behavior: Behavior normal.      UC Treatments / Results  Labs (all labs ordered are listed, but only abnormal results are displayed) Labs Reviewed  COMPREHENSIVE METABOLIC PANEL WITH GFR    EKG   Radiology No results found.  Procedures Procedures (including critical care time)  Medications Ordered in UC Medications  0.9 %  sodium chloride  infusion (0 mLs Intravenous Stopped 03/19/24 1348)  ondansetron  (ZOFRAN -ODT) disintegrating tablet 4 mg (4 mg Oral Given 03/19/24 1301)    Initial Impression / Assessment and Plan / UC Course  I have reviewed the triage vital signs and the nursing notes.  Pertinent labs & imaging results that were available during my care of the patient were reviewed by me and considered in my medical decision making (see chart for details).     Patient is a pleasant 29 y.o. female presenting with gastroenteritis. The patient is afebrile and nontachycardic.  Antipyretic has not been administered today. She is on her menstrual cycle, and also had a negative home pregnancy test.  She is breast-feeding.  There is scant bright red blood in the diarrhea, which is attributed to hemorrhoid. On exam, she had generalized abd pain and increased BS.  Low concern for c dif. No recent abx. No recent travel.  She is tolerating fluids PO. Discussed option of continued hydration PO, or IV fluids. Patient  preference for IV fluids.  Bolus of normal saline administered. Following this, the patient feels significantly better.  Discussed option of Zofran  ODT vs IM. Pt preference for ODT. Administered during visit.  She is tolerating fluids p.o.  Zofran  ODT sent.  Continue Imodium.  Good hydration.  Discussed limitations of urgent care.  She understands that if her symptoms do not improve with the above regimen within about 24 hours, or if her symptoms worsen at any time, she should go to the emergency department.  This includes continued diarrhea, unable to tolerate fluids, worsening abdominal pain, new fevers.  Final Clinical Impressions(s) / UC Diagnoses   Final diagnoses:  Diarrhea, unspecified type  Nausea without vomiting     Discharge Instructions      - We administered IV fluids during your visit today. -When you go home, continue drinking plenty of water, or liquid IV/Gatorade. -Take the Zofran  (ondansetron ) up to 3 times daily for nausea and vomiting. Dissolve one pill under your tongue or between your teeth and your cheek. -You can purchase Imodium (loperamide) over-the-counter. Take the Imodium (loperamide) up to 4 times daily for diarrhea. - Zofran  and Imodium are considered safe for breast-feeding. -If your symptoms worsen, including worsening abdominal pain, you cannot keep fluids down despite treatment, you develop new fevers, there is blood in the toilet bowl with bowel movements-seek additional medical attention, or go to the emergency department.     ED Prescriptions     Medication Sig Dispense Auth. Provider   ondansetron  (ZOFRAN -ODT)  4 MG disintegrating tablet Take 1 tablet (4 mg total) by mouth every 8 (eight) hours as needed for nausea or vomiting. 21 tablet Musab Wingard E, PA-C      PDMP not reviewed this encounter.     [1]  Social History Tobacco Use   Smoking status: Never   Smokeless tobacco: Never  Vaping Use   Vaping status: Never Used  Substance  Use Topics   Alcohol use: No   Drug use: No     Arlyss Leita BRAVO, PA-C 03/19/24 1358  "

## 2024-03-19 NOTE — Discharge Instructions (Addendum)
-   We administered IV fluids during your visit today. -When you go home, continue drinking plenty of water, or liquid IV/Gatorade. -Take the Zofran  (ondansetron ) up to 3 times daily for nausea and vomiting. Dissolve one pill under your tongue or between your teeth and your cheek. -You can purchase Imodium (loperamide) over-the-counter. Take the Imodium (loperamide) up to 4 times daily for diarrhea. - Zofran  and Imodium are considered safe for breast-feeding. -If your symptoms worsen, including worsening abdominal pain, you cannot keep fluids down despite treatment, you develop new fevers, there is blood in the toilet bowl with bowel movements-seek additional medical attention, or go to the emergency department.

## 2024-03-20 ENCOUNTER — Ambulatory Visit (HOSPITAL_COMMUNITY): Payer: Self-pay

## 2024-03-20 LAB — COMPREHENSIVE METABOLIC PANEL WITH GFR
ALT: 47 IU/L — ABNORMAL HIGH (ref 0–32)
AST: 21 IU/L (ref 0–40)
Albumin: 5.1 g/dL — ABNORMAL HIGH (ref 4.0–5.0)
Alkaline Phosphatase: 80 IU/L (ref 41–116)
BUN/Creatinine Ratio: 16 (ref 9–23)
BUN: 10 mg/dL (ref 6–20)
Bilirubin Total: 0.6 mg/dL (ref 0.0–1.2)
CO2: 16 mmol/L — ABNORMAL LOW (ref 20–29)
Calcium: 9.2 mg/dL (ref 8.7–10.2)
Chloride: 105 mmol/L (ref 96–106)
Creatinine, Ser: 0.61 mg/dL (ref 0.57–1.00)
Globulin, Total: 2.8 g/dL (ref 1.5–4.5)
Glucose: 92 mg/dL (ref 70–99)
Potassium: 3.5 mmol/L (ref 3.5–5.2)
Sodium: 139 mmol/L (ref 134–144)
Total Protein: 7.9 g/dL (ref 6.0–8.5)
eGFR: 124 mL/min/1.73
